# Patient Record
Sex: Female | Born: 1950 | Race: White | Hispanic: No | Smoking: Never smoker
Health system: Southern US, Community
[De-identification: ages and names within clinical notes are randomized; demographics above are authoritative.]

## PROBLEM LIST (undated history)

## (undated) DIAGNOSIS — M810 Age-related osteoporosis without current pathological fracture: Secondary | ICD-10-CM

## (undated) DIAGNOSIS — Z789 Other specified health status: Secondary | ICD-10-CM

## (undated) HISTORY — PX: CRYOTHERAPY: SHX1416

## (undated) HISTORY — DX: Other specified health status: Z78.9

## (undated) HISTORY — DX: Age-related osteoporosis without current pathological fracture: M81.0

---

## 1998-12-15 ENCOUNTER — Ambulatory Visit (HOSPITAL_COMMUNITY): Admission: RE | Admit: 1998-12-15 | Discharge: 1998-12-15 | Payer: Self-pay | Admitting: *Deleted

## 1999-06-01 ENCOUNTER — Ambulatory Visit (HOSPITAL_BASED_OUTPATIENT_CLINIC_OR_DEPARTMENT_OTHER): Admission: RE | Admit: 1999-06-01 | Discharge: 1999-06-01 | Payer: Self-pay | Admitting: Orthopedic Surgery

## 2001-09-03 ENCOUNTER — Encounter: Payer: Self-pay | Admitting: Family Medicine

## 2001-09-03 ENCOUNTER — Ambulatory Visit (HOSPITAL_COMMUNITY): Admission: RE | Admit: 2001-09-03 | Discharge: 2001-09-03 | Payer: Self-pay | Admitting: Family Medicine

## 2002-06-03 ENCOUNTER — Other Ambulatory Visit: Admission: RE | Admit: 2002-06-03 | Discharge: 2002-06-03 | Payer: Self-pay | Admitting: Obstetrics and Gynecology

## 2003-06-15 ENCOUNTER — Other Ambulatory Visit: Admission: RE | Admit: 2003-06-15 | Discharge: 2003-06-15 | Payer: Self-pay | Admitting: Obstetrics and Gynecology

## 2004-08-10 ENCOUNTER — Other Ambulatory Visit: Admission: RE | Admit: 2004-08-10 | Discharge: 2004-08-10 | Payer: Self-pay | Admitting: Obstetrics and Gynecology

## 2005-06-06 ENCOUNTER — Encounter: Admission: RE | Admit: 2005-06-06 | Discharge: 2005-06-06 | Payer: Self-pay | Admitting: Family Medicine

## 2006-12-19 ENCOUNTER — Encounter (INDEPENDENT_AMBULATORY_CARE_PROVIDER_SITE_OTHER): Payer: Self-pay | Admitting: *Deleted

## 2006-12-19 ENCOUNTER — Ambulatory Visit (HOSPITAL_COMMUNITY): Admission: RE | Admit: 2006-12-19 | Discharge: 2006-12-19 | Payer: Self-pay | Admitting: Gastroenterology

## 2007-12-21 ENCOUNTER — Ambulatory Visit (HOSPITAL_COMMUNITY): Admission: RE | Admit: 2007-12-21 | Discharge: 2007-12-21 | Payer: Self-pay | Admitting: Chiropractic Medicine

## 2009-07-11 ENCOUNTER — Emergency Department (HOSPITAL_COMMUNITY): Admission: EM | Admit: 2009-07-11 | Discharge: 2009-07-11 | Payer: Self-pay | Admitting: Emergency Medicine

## 2009-07-14 ENCOUNTER — Ambulatory Visit: Payer: Self-pay | Admitting: Family Medicine

## 2009-07-14 DIAGNOSIS — M25519 Pain in unspecified shoulder: Secondary | ICD-10-CM | POA: Insufficient documentation

## 2010-06-21 ENCOUNTER — Ambulatory Visit: Payer: Self-pay | Admitting: Sports Medicine

## 2010-12-04 NOTE — Assessment & Plan Note (Signed)
Summary: SHOULDER/NECK PAIN,MC   Vital Signs:  Patient profile:   60 year old female Height:      65 inches Weight:      130 pounds BMI:     21.71 BP sitting:   107 / 80  Vitals Entered By: Lillia Pauls CMA (June 21, 2010 3:07 PM)  CC:  shoulder pain.  History of Present Illness: 60 yo F:  1. Shoulder Pain: x 2-3 years, hurts daily, epsecially mid-day. pain primarily in mid back.  injury about 6 yrs ago - was mowing and arm was jerked down by a lawnmower rolling down into a ditch. felt a pull at that time. occasionally has numbness in her r thumb and a burning sensation in the area medial to scapula when her shoulder pain is worst. has tried multiple therapy forms (TENS, PT, MM relaxers, massage, tramadol, acupuncture, ionophoresis).  most didn't help at all.  medications made her feel sick.  lidocaine patch did help. also feels like someone is " pushing their thumb" into the spot on her back that hurts. right hand dominant. No surgery to back or shoulder. works at [employer] as a Diplomatic Services operational officer.  Allergies (verified): No Known Drug Allergies  Review of Systems MS:  Complains of joint pain and mid back pain; denies joint redness, joint swelling, loss of strength, and low back pain. Neuro:  Complains of numbness and tingling.  Physical Exam  General:  Well-developed, well-nourished, in no acute distress; alert, appropriate and cooperative throughout examination. Vitals reviewed.  Msk:  Patient slender but with good muscle bulk and tone generally. Prominent upper trapezius muscles, patient's head in forward position.  ttp along thoracic paraspinal mm, rhomboid mm R > L.  No ttp along spinus processes. Normal neck ROM. R scapula   ~ 1 inch more lateral than left - dysyncrony with upward tracking with wall push.  Shoulder rotator cuff strength B intact and symmetrical, except mild pain and weakness with supraspinatus testing. FROM shoulder joint. Neg compression. Neurologic:  Normal gross  neuro exam upper extremity.   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 Likely 2/2 old injury. Her updated medication list for this problem includes:    Meloxicam 15 Mg Tabs (Meloxicam) ..... One by mouth daily  scap stabilization exercises add ext rotation, flys, rows, etc  MSK [place] on RTC if not improving  doiubt radiation f neck as has great neck ROM  Complete Medication List: 1)  Amitriptyline Hcl 25 Mg Tabs (Amitriptyline hcl) .... One by mouth q hs 2)  Meloxicam 15 Mg Tabs (Meloxicam) .... One by mouth daily  Patient Instructions: 1)  It was nice to meet you today. 2)  Complete the exercises provided in the handout daily. 3)  Follow up in 4 weeks. Prescriptions: MELOXICAM 15 MG TABS (MELOXICAM) one by mouth daily  #30 x 1   Entered by:   Lillia Pauls CMA   Authorized by:   Enid Baas MD   Signed by:   Lillia Pauls CMA on 06/21/2010   Method used:   Print then Give to Patient   RxID:   [id number] AMITRIPTYLINE HCL 25 MG TABS (AMITRIPTYLINE HCL) one by mouth q hs  #30 x 1   Entered by:   Lillia Pauls CMA   Authorized by:   Enid Baas MD   Signed by:   Lillia Pauls CMA on 06/21/2010   Method used:  Print then Give to Patient   RxID:   [id number] MELOXICAM 15 MG TABS (MELOXICAM) one by mouth daily  #30 x 1   Entered by:   Helane Rima DO   Authorized by:   Enid Baas MD   Signed by:   Helane Rima DO on 06/21/2010   Method used:   Print then Give to Patient   RxID:   [id number] AMITRIPTYLINE HCL 25 MG TABS (AMITRIPTYLINE HCL) one by mouth q hs  #30 x 1   Entered by:   Helane Rima DO   Authorized by:   Enid Baas MD   Signed by:   Helane Rima DO on 06/21/2010   Method used:   Print then Give to Patient   RxID:   [id number]

## 2011-03-22 NOTE — Op Note (Signed)
Robin Maldonado, Robin Maldonado                  ACCOUNT NO.:  [id number]   MEDICAL RECORD NO.:  [id number]          PATIENT TYPE:  AMB   LOCATION:  ENDO                         FACILITY:  [facility]   PHYSICIAN:  Bernette Redbird, M.D.   DATE OF BIRTH:  [birth year]   DATE OF PROCEDURE:  12/19/2006  DATE OF DISCHARGE:                               OPERATIVE REPORT   PROCEDURE:  Upper endoscopy with biopsies.   INDICATIONS:  A 60 year old female with periodic nausea.   FINDINGS:  Prepyloric, antral ulceration and erosions suggestive of  aspirin/NSAID gastropathy; although the patient denies consistent or  frequent usage of such medication.  Also, short segment Barrett's  esophagus.   PROCEDURE:  The patient provided written consent for the procedure.  Sedation was fentanyl 75 mcg and Versed 8 mg IV, without arrhythmias or  desaturation.  The Pentax video endoscope was passed under direct  vision, entering the esophagus without undue difficulty.  The vocal  cords looked grossly normal.  The esophagus was normal, except distally,  where there was a postage stamp size tongue of Barrett's appearing  mucosa -- extending perhaps 2 cm above the lower esophageal sphincter  and more proximally than the circumferential Z-line and minimal hiatal  hernia appeared to be.  No ring, stricture, mass or active inflammation  were appreciated.   The stomach was entered.  It contained no residual to speak of.   The antrum of the stomach had focal erosive changes.  In the prepyloric  area, there was actually quite a bit of erythema, edema and a slit-like  linear ulcer with surrounding mucosal edema, with the ulcer measuring  approximately to 2-3 mm across and about 6 mm in length.  It had a  benign appearance, despite the edema and erythema of the surrounding  mucosa.  On the posterior wall of the antrum was shallow erosion, and  there was also one other focus of slight mucosal edema and erythema.  Biopsies of the ulcer,  the erosion and the gastric antrum were obtained.  The remainder of the stomach was normal, including a retroflexed view of  the cardia.  The pylorus, duodenal bulb and second duodenum looked  normal.  Biopsies were obtained from the small Barrett's patch prior to  removal of the scope.  The patient tolerated the procedure well and  there were no apparent complications.   IMPRESSION:  Antral gastritis characterized by shallow ulceration and  erosive changes, with associated mucosal erythema.  Etiology unclear,  since we do not get a history from the patient of significant aspirin or  NSAID exposure.   PLAN:  Await pathology results.  Consider H. pylori therapy if there is  evidence of H. pylori infection.           ______________________________  Bernette Redbird, M.D.     RB/MEDQ  D:  12/19/2006  T:  12/19/2006  Job:  [id number]

## 2011-03-22 NOTE — Op Note (Signed)
Robin Maldonado, Robin Maldonado                  ACCOUNT NO.:  [id number]   MEDICAL RECORD NO.:  [id number]          PATIENT TYPE:  AMB   LOCATION:  ENDO                         FACILITY:  [facility]   PHYSICIAN:  Bernette Redbird, M.D.   DATE OF BIRTH:  1950-12-16   DATE OF PROCEDURE:  12/19/2006  DATE OF DISCHARGE:                               OPERATIVE REPORT   PROCEDURE:  Colonoscopy with biopsy.   INDICATIONS:  60 year old female for initial colon cancer screening  examination.  There is a family history of colon cancer in her mother at  age 24.   FINDINGS:  Solitary diminutive polyp.  Melanosis coli.   PROCEDURE:  The patient provided written consent.  Sedation for this  procedure and the upper endoscopy which preceded it, totaled fentanyl  125 mcg and Versed 12 mg IV without arrhythmias or desaturation.  The  Pentax pediatric video colonoscope was advanced with substantial  difficulty due to looping, ultimately overcome by taking out loops,  having the patient in the supine position, and applying external  abdominal compression.  I reached the terminal ileum which had a normal  appearance and pullback was then performed.  There were two  abnormalities noted on this exam.  The first was a 3 mm slightly  erythematous sessile polyp near the splenic flexure removed by a couple  of cold biopsies, and the other was mild to moderate diffuse melanosis  coli.  No large polyps, cancer, colitis, vascular malformations, or  diverticular disease were appreciated and retroflexion of the rectum and  reinspection of the rectum were unremarkable.  The patient tolerated the  procedure well and there no apparent complications.   IMPRESSION:  1. Solitary diminutive polyp, path pending.  2. Melanosis coli.  3. Family history of colon cancer.   PLAN:  Await pathology, will anticipate colonoscopic follow-up in five  years in view of the family history.           ______________________________  Bernette Redbird, M.D.     RB/MEDQ  D:  12/19/2006  T:  12/19/2006  Job:  [id number]   cc:   Guy Sandifer. Henderson Cloud, M.D.

## 2014-06-23 ENCOUNTER — Other Ambulatory Visit (HOSPITAL_BASED_OUTPATIENT_CLINIC_OR_DEPARTMENT_OTHER): Payer: Self-pay | Admitting: Specialist

## 2014-06-23 DIAGNOSIS — Z1231 Encounter for screening mammogram for malignant neoplasm of breast: Secondary | ICD-10-CM

## 2014-07-01 ENCOUNTER — Ambulatory Visit (HOSPITAL_BASED_OUTPATIENT_CLINIC_OR_DEPARTMENT_OTHER): Payer: Self-pay

## 2014-07-13 ENCOUNTER — Ambulatory Visit (HOSPITAL_BASED_OUTPATIENT_CLINIC_OR_DEPARTMENT_OTHER): Payer: Self-pay

## 2014-07-25 ENCOUNTER — Ambulatory Visit (HOSPITAL_BASED_OUTPATIENT_CLINIC_OR_DEPARTMENT_OTHER)
Admission: RE | Admit: 2014-07-25 | Discharge: 2014-07-25 | Disposition: A | Discharge status: Home / Self Care | Payer: 59 | Source: Ambulatory Visit | Attending: Specialist | Admitting: Specialist

## 2014-07-25 DIAGNOSIS — Z1231 Encounter for screening mammogram for malignant neoplasm of breast: Secondary | ICD-10-CM | POA: Diagnosis present

## 2014-10-06 ENCOUNTER — Ambulatory Visit: Payer: Self-pay | Admitting: Internal Medicine

## 2014-10-10 ENCOUNTER — Encounter: Payer: Self-pay | Admitting: Internal Medicine

## 2016-01-24 ENCOUNTER — Ambulatory Visit (INDEPENDENT_AMBULATORY_CARE_PROVIDER_SITE_OTHER): Payer: 59 | Admitting: Obstetrics & Gynecology

## 2016-01-24 ENCOUNTER — Encounter: Payer: Self-pay | Admitting: Obstetrics & Gynecology

## 2016-01-24 VITALS — BP 117/62 | HR 81 | Ht 65.0 in | Wt 129.0 lb

## 2016-01-24 DIAGNOSIS — Z1239 Encounter for other screening for malignant neoplasm of breast: Secondary | ICD-10-CM | POA: Diagnosis not present

## 2016-01-24 DIAGNOSIS — Z1283 Encounter for screening for malignant neoplasm of skin: Secondary | ICD-10-CM | POA: Diagnosis not present

## 2016-01-24 DIAGNOSIS — Z1151 Encounter for screening for human papillomavirus (HPV): Secondary | ICD-10-CM

## 2016-01-24 DIAGNOSIS — Z124 Encounter for screening for malignant neoplasm of cervix: Secondary | ICD-10-CM

## 2016-01-24 DIAGNOSIS — Z01419 Encounter for gynecological examination (general) (routine) without abnormal findings: Secondary | ICD-10-CM | POA: Diagnosis not present

## 2016-01-24 NOTE — Patient Instructions (Signed)
Osteoporosis  Osteoporosis is the thinning and loss of density in the bones. Osteoporosis makes the bones more brittle, fragile, and likely to break (fracture). Over time, osteoporosis can cause the bones to become so weak that they fracture after a simple fall. The bones most likely to fracture are the bones in the hip, wrist, and spine.  CAUSES   The exact cause is not known.  RISK FACTORS  Anyone can develop osteoporosis. You may be at greater risk if you have a family history of the condition or have poor nutrition. You may also have a higher risk if you are:   · Female.    · 50 years old or older.  · A smoker.  · Not physically active.    · White or Asian.  · Slender.  SIGNS AND SYMPTOMS   A fracture might be the first sign of the disease, especially if it results from a fall or injury that would not usually cause a bone to break. Other signs and symptoms include:   · Low back and neck pain.  · Stooped posture.  · Height loss.  DIAGNOSIS   To make a diagnosis, your health care provider may:  · Take a medical history.  · Perform a physical exam.  · Order tests, such as:    A bone mineral density test.    A dual-energy X-ray absorptiometry test.  TREATMENT   The goal of osteoporosis treatment is to strengthen your bones to reduce your risk of a fracture. Treatment may involve:  · Making lifestyle changes, such as:    Eating a diet rich in calcium.    Doing weight-bearing and muscle-strengthening exercises.    Stopping tobacco use.    Limiting alcohol intake.  · Taking medicine to slow the process of bone loss or to increase bone density.  · Monitoring your levels of calcium and vitamin D.  HOME CARE INSTRUCTIONS  · Include calcium and vitamin D in your diet. Calcium is important for bone health, and vitamin D helps the body absorb calcium.  · Perform weight-bearing and muscle-strengthening exercises as directed by your health care provider.  · Do not use any tobacco products, including cigarettes, chewing  tobacco, and electronic cigarettes. If you need help quitting, ask your health care provider.  · Limit your alcohol intake.  · Take medicines only as directed by your health care provider.  · Keep all follow-up visits as directed by your health care provider. This is important.  · Take precautions at home to lower your risk of falling, such as:    Keeping rooms well lit and clutter free.    Installing safety rails on stairs.    Using rubber mats in the bathroom and other areas that are often wet or slippery.  SEEK IMMEDIATE MEDICAL CARE IF:   You fall or injure yourself.      This information is not intended to replace advice given to you by your health care provider. Make sure you discuss any questions you have with your health care provider.     Document Released: 07/31/2005 Document Revised: 11/11/2014 Document Reviewed: 03/31/2014  Elsevier Interactive Patient Education ©2016 [employer]

## 2016-01-24 NOTE — Progress Notes (Signed)
Patient ID: Robin Maldonado, female   DOB: Mar 18, 1951, 65 y.o.   MRN: [id number] Subjective:     Robin Maldonado is a 65 y.o. female here for a routine exam.  G2P1011. Current complaints: no GYN complaints. LMP early 79's.  Widowed.  Pt not sexually active.   Gynecologic History No LMP recorded. Patient is postmenopausal. Contraception: tubal ligation Last Pap: 2015. Results were: normal (by Dr. Micah Noel.  Normal per pt) Last mammogram: 07/25/2014. Results were: normal  Obstetric History OB History  Gravida Para Term Preterm AB SAB TAB Ectopic Multiple Living  2 1 1       1     # Outcome Date GA Lbr Len/2nd Weight Sex Delivery Anes PTL Lv  2 Gravida           1 Term      Vag-Spont          The following portions of the patient's history were reviewed and updated as appropriate: allergies, current medications, past family history, past medical history, past social history, past surgical history and problem list.  Review of Systems Pertinent items are noted in HPI.    Objective:  BP 117/62 mmHg  Pulse 81  Ht 5\' 5"  (1.651 m)  Wt 129 lb (58.514 kg)  BMI 21.47 kg/m2 General Appearance:    Alert, cooperative, no distress, appears stated age  Head:    Normocephalic, without obvious abnormality, atraumatic  Eyes:    conjunctiva/corneas clear, EOM's intact, both eyes  Ears:    Normal external ear canals, both ears  Nose:   Nares normal, septum midline, mucosa normal, no drainage    or sinus tenderness  Throat:   Lips, mucosa, and tongue normal; teeth and gums normal  Neck:   Supple, symmetrical, trachea midline, no adenopathy;    thyroid:  no enlargement/tenderness/nodules  Back:     Symmetric, no curvature, ROM normal, no CVA tenderness  Lungs:     Clear to auscultation bilaterally, respirations unlabored  Chest Wall:    No tenderness or deformity   Heart:    Regular rate and rhythm, S1 and S2 normal, no murmur, rub   or gallop  Breast Exam:    No tenderness, masses, or nipple  abnormality  Abdomen:     Soft, non-tender, bowel sounds active all four quadrants,    no masses, no organomegaly  Genitalia:    Normal female without lesion, discharge or tenderness     Extremities:   Extremities normal, atraumatic, no cyanosis or edema  Pulses:   2+ and symmetric all extremities  Skin:   Skin color, texture, turgor normal, multiple skin lesions- suspect begn moles vs a few areas of SK.  There are 2 concerning lesion of pts abd- irreg borders with color irreg       Assessment:    Healthy female exam.   Skin lesions- need derm appt to do skin cancer screen increased risk for osteoporosis- thin, fair, premature menopause.  Needs screen at age 65years Breast cancer screen    Plan:    Mammogram ordered. Follow up in: 1 year.    Caltrate 2 po q day F/u PAP Labs: CMP, TSH, lipid panel, vit D.  Pt will have labs drawn fasting on the day of her mammogram  BMD scan in 1 year  Referral to derm for skin exam Obtain records from Dr. Cyndie Chime [facility]  [place]. Harraway-Smith, M.D., Cherlynn June

## 2016-01-26 LAB — CYTOLOGY - PAP

## 2016-01-29 ENCOUNTER — Other Ambulatory Visit (INDEPENDENT_AMBULATORY_CARE_PROVIDER_SITE_OTHER): Payer: 59

## 2016-01-29 ENCOUNTER — Other Ambulatory Visit: Payer: Self-pay | Admitting: Obstetrics & Gynecology

## 2016-01-29 ENCOUNTER — Ambulatory Visit (HOSPITAL_BASED_OUTPATIENT_CLINIC_OR_DEPARTMENT_OTHER)
Admission: RE | Admit: 2016-01-29 | Discharge: 2016-01-29 | Disposition: A | Discharge status: Home / Self Care | Payer: 59 | Source: Ambulatory Visit | Attending: Obstetrics & Gynecology | Admitting: Obstetrics & Gynecology

## 2016-01-29 DIAGNOSIS — Z1239 Encounter for other screening for malignant neoplasm of breast: Secondary | ICD-10-CM | POA: Insufficient documentation

## 2016-01-29 DIAGNOSIS — Z1231 Encounter for screening mammogram for malignant neoplasm of breast: Secondary | ICD-10-CM | POA: Insufficient documentation

## 2016-01-29 DIAGNOSIS — Z01419 Encounter for gynecological examination (general) (routine) without abnormal findings: Secondary | ICD-10-CM

## 2016-01-29 LAB — CBC
HEMATOCRIT: 41.1 % (ref 36.0–46.0)
HEMOGLOBIN: 13.6 g/dL (ref 12.0–15.0)
MCH: 30.6 pg (ref 26.0–34.0)
MCHC: 33.1 g/dL (ref 30.0–36.0)
MCV: 92.4 fL (ref 78.0–100.0)
MPV: 10.9 fL (ref 8.6–12.4)
Platelets: 274 10*3/uL (ref 150–400)
RBC: 4.45 MIL/uL (ref 3.87–5.11)
RDW: 13.9 % (ref 11.5–15.5)
WBC: 4.8 10*3/uL (ref 4.0–10.5)

## 2016-01-29 LAB — TSH: TSH: 1.11 mIU/L

## 2016-01-29 NOTE — Progress Notes (Signed)
Patient presents for fasting lab work. labwork collected. Kathrene Alu RN BSN

## 2016-01-30 LAB — COMPREHENSIVE METABOLIC PANEL
ALT: 11 U/L (ref 6–29)
AST: 17 U/L (ref 10–35)
Albumin: 4 g/dL (ref 3.6–5.1)
Alkaline Phosphatase: 75 U/L (ref 33–130)
BUN: 10 mg/dL (ref 7–25)
CHLORIDE: 107 mmol/L (ref 98–110)
CO2: 22 mmol/L (ref 20–31)
CREATININE: 0.89 mg/dL (ref 0.50–0.99)
Calcium: 8.9 mg/dL (ref 8.6–10.4)
Glucose, Bld: 75 mg/dL (ref 65–99)
POTASSIUM: 4.4 mmol/L (ref 3.5–5.3)
SODIUM: 143 mmol/L (ref 135–146)
TOTAL PROTEIN: 6.7 g/dL (ref 6.1–8.1)
Total Bilirubin: 0.4 mg/dL (ref 0.2–1.2)

## 2016-01-30 LAB — LIPID PANEL
CHOL/HDL RATIO: 2.7 ratio (ref <=5.0)
Cholesterol: 240 mg/dL — ABNORMAL HIGH (ref 125–200)
HDL: 89 mg/dL (ref >=46)
LDL CALC: 140 mg/dL — AB (ref <130)
TRIGLYCERIDES: 54 mg/dL (ref <150)
VLDL: 11 mg/dL (ref <30)

## 2016-01-30 LAB — VITAMIN D 25 HYDROXY (VIT D DEFICIENCY, FRACTURES): VIT D 25 HYDROXY: 24 ng/mL — AB (ref 30–100)

## 2016-03-13 ENCOUNTER — Telehealth: Payer: Self-pay

## 2016-03-13 NOTE — Telephone Encounter (Signed)
Patient called and made aware of low vitamin D level and that she should begin taking over the counter Vitamin D 2000 Unit per day and also increase her calicum. Patient states she did get the calcium chews after her appointment in March. Patient made aware that she will need to have vitamin D repeated in one year. Also mentioned that her cholesterol was on elevated some and she should discuss and follow up with primary care.  Patient mentioned that dermatology had not called her about her referral appointment so patient given number to [facility] [address] in [place] to call and set up appointment. Kathrene Alu RN BSN

## 2016-03-13 NOTE — Telephone Encounter (Signed)
-----   Message from Lavonia Drafts, MD sent at 03/12/2016  9:28 AM EDT ----- Please call pt. Her March labs showed that her Vit D level is low.  She should begin OTC Vit D 2000 units per day. She should also take calcium if she is not already taking it.  She should have her level repeated in 1 year after treatment. (Vit D helps with bone strength and decreases risk of cancer among other things).  Her cholesterol was also a bit elevated and she should f/u with her primary care physician to discuss.  Thx,  clh-S

## 2017-01-03 DIAGNOSIS — H524 Presbyopia: Secondary | ICD-10-CM | POA: Diagnosis not present

## 2017-01-03 DIAGNOSIS — H5213 Myopia, bilateral: Secondary | ICD-10-CM | POA: Diagnosis not present

## 2017-01-03 DIAGNOSIS — H52223 Regular astigmatism, bilateral: Secondary | ICD-10-CM | POA: Diagnosis not present

## 2017-04-08 ENCOUNTER — Other Ambulatory Visit: Payer: Self-pay | Admitting: Obstetrics & Gynecology

## 2017-04-08 DIAGNOSIS — Z1231 Encounter for screening mammogram for malignant neoplasm of breast: Secondary | ICD-10-CM

## 2017-04-14 ENCOUNTER — Encounter (HOSPITAL_BASED_OUTPATIENT_CLINIC_OR_DEPARTMENT_OTHER): Payer: Self-pay

## 2017-04-14 ENCOUNTER — Ambulatory Visit (HOSPITAL_BASED_OUTPATIENT_CLINIC_OR_DEPARTMENT_OTHER)
Admission: RE | Admit: 2017-04-14 | Discharge: 2017-04-14 | Disposition: A | Discharge status: Home / Self Care | Payer: 59 | Source: Ambulatory Visit | Attending: Obstetrics & Gynecology | Admitting: Obstetrics & Gynecology

## 2017-04-14 DIAGNOSIS — Z1231 Encounter for screening mammogram for malignant neoplasm of breast: Secondary | ICD-10-CM | POA: Diagnosis not present

## 2017-08-08 ENCOUNTER — Emergency Department (HOSPITAL_BASED_OUTPATIENT_CLINIC_OR_DEPARTMENT_OTHER): Payer: 59

## 2017-08-08 ENCOUNTER — Encounter (HOSPITAL_BASED_OUTPATIENT_CLINIC_OR_DEPARTMENT_OTHER): Payer: Self-pay | Admitting: *Deleted

## 2017-08-08 ENCOUNTER — Emergency Department (HOSPITAL_BASED_OUTPATIENT_CLINIC_OR_DEPARTMENT_OTHER)
Admission: EM | Admit: 2017-08-08 | Discharge: 2017-08-08 | Disposition: A | Discharge status: Home / Self Care | Payer: 59 | Attending: Emergency Medicine | Admitting: Emergency Medicine

## 2017-08-08 DIAGNOSIS — H9201 Otalgia, right ear: Secondary | ICD-10-CM | POA: Insufficient documentation

## 2017-08-08 DIAGNOSIS — R509 Fever, unspecified: Secondary | ICD-10-CM | POA: Insufficient documentation

## 2017-08-08 DIAGNOSIS — R070 Pain in throat: Secondary | ICD-10-CM | POA: Diagnosis not present

## 2017-08-08 DIAGNOSIS — J40 Bronchitis, not specified as acute or chronic: Secondary | ICD-10-CM | POA: Insufficient documentation

## 2017-08-08 DIAGNOSIS — R110 Nausea: Secondary | ICD-10-CM | POA: Diagnosis not present

## 2017-08-08 DIAGNOSIS — R05 Cough: Secondary | ICD-10-CM | POA: Insufficient documentation

## 2017-08-08 DIAGNOSIS — R059 Cough, unspecified: Secondary | ICD-10-CM

## 2017-08-08 LAB — CBC WITH DIFFERENTIAL/PLATELET
Basophils Absolute: 0 10*3/uL (ref 0.0–0.1)
Basophils Relative: 0 %
Eosinophils Absolute: 0.2 10*3/uL (ref 0.0–0.7)
Eosinophils Relative: 2 %
HEMATOCRIT: 40.8 % (ref 36.0–46.0)
HEMOGLOBIN: 13.2 g/dL (ref 12.0–15.0)
LYMPHS ABS: 1.8 10*3/uL (ref 0.7–4.0)
LYMPHS PCT: 18 %
MCH: 30.6 pg (ref 26.0–34.0)
MCHC: 32.4 g/dL (ref 30.0–36.0)
MCV: 94.4 fL (ref 78.0–100.0)
MONOS PCT: 9 %
Monocytes Absolute: 0.9 10*3/uL (ref 0.1–1.0)
NEUTROS ABS: 7.3 10*3/uL (ref 1.7–7.7)
NEUTROS PCT: 71 %
Platelets: 277 10*3/uL (ref 150–400)
RBC: 4.32 MIL/uL (ref 3.87–5.11)
RDW: 13.6 % (ref 11.5–15.5)
WBC: 10.3 10*3/uL (ref 4.0–10.5)

## 2017-08-08 LAB — URINALYSIS, ROUTINE W REFLEX MICROSCOPIC
BILIRUBIN URINE: NEGATIVE
Glucose, UA: NEGATIVE mg/dL
HGB URINE DIPSTICK: NEGATIVE
KETONES UR: 15 mg/dL — AB
Leukocytes, UA: NEGATIVE
NITRITE: NEGATIVE
PH: 6 (ref 5.0–8.0)
Protein, ur: NEGATIVE mg/dL

## 2017-08-08 LAB — COMPREHENSIVE METABOLIC PANEL
ALK PHOS: 99 U/L (ref 38–126)
ALT: 21 U/L (ref 14–54)
AST: 20 U/L (ref 15–41)
Albumin: 3.9 g/dL (ref 3.5–5.0)
Anion gap: 8 (ref 5–15)
BILIRUBIN TOTAL: 0.5 mg/dL (ref 0.3–1.2)
BUN: 11 mg/dL (ref 6–20)
CALCIUM: 9.1 mg/dL (ref 8.9–10.3)
CHLORIDE: 105 mmol/L (ref 101–111)
CO2: 26 mmol/L (ref 22–32)
CREATININE: 0.74 mg/dL (ref 0.44–1.00)
Glucose, Bld: 123 mg/dL — ABNORMAL HIGH (ref 65–99)
Potassium: 3.5 mmol/L (ref 3.5–5.1)
Sodium: 139 mmol/L (ref 135–145)
Total Protein: 7.4 g/dL (ref 6.5–8.1)

## 2017-08-08 LAB — I-STAT CG4 LACTIC ACID, ED: LACTIC ACID, VENOUS: 1.04 mmol/L (ref 0.5–1.9)

## 2017-08-08 LAB — TSH: TSH: 1.289 u[IU]/mL (ref 0.350–4.500)

## 2017-08-08 MED ORDER — SODIUM CHLORIDE 0.9 % IV BOLUS (SEPSIS)
1000.0000 mL | Freq: Once | INTRAVENOUS | Status: AC
  Administered 2017-08-08: 1000 mL via INTRAVENOUS

## 2017-08-08 MED ORDER — DOXYCYCLINE HYCLATE 100 MG PO TABS
100.0000 mg | ORAL_TABLET | Freq: Once | ORAL | Status: AC
  Administered 2017-08-08: 100 mg via ORAL
  Filled 2017-08-08: qty 1

## 2017-08-08 MED ORDER — ONDANSETRON HCL 4 MG/2ML IJ SOLN
4.0000 mg | Freq: Once | INTRAMUSCULAR | Status: AC
  Administered 2017-08-08: 4 mg via INTRAVENOUS
  Filled 2017-08-08: qty 2

## 2017-08-08 MED ORDER — DOXYCYCLINE HYCLATE 100 MG PO CAPS: 100.0000 mg | ORAL_CAPSULE | Freq: Two times a day (BID) | ORAL | 0 refills | Status: DC

## 2017-08-08 NOTE — ED Triage Notes (Signed)
Fever, cough and sore throat since Monday. Last dose of Ibuprofen was last night.

## 2017-08-08 NOTE — ED Notes (Signed)
Patient stated that she felt a lot better but still light headed.

## 2017-08-08 NOTE — Discharge Instructions (Signed)
Stay hydrated.   You may have an early pneumonia. Take doxycycline twice daily for a week.   See your doctor  Return to ER if you have fever, trouble breathing, abdominal pain, vomiting, dehydration

## 2017-08-08 NOTE — ED Provider Notes (Signed)
Robin Maldonado Note   CSN: [id number] Arrival date & time: 08/08/17  1814     History   Chief Complaint Chief Complaint  Patient presents with  . Fever  . Cough  . Sore Throat    HPI Robin Maldonado is a 66 y.o. female otherwise healthy here presenting with cough, sore throat, subjective fevers. Patient has some productive cough for the last 5 days as well as right ear pain. Patient has subjective fevers but did not take her temperature. Also has intermittent sore throat as well. Patient overall had some poor appetite and feel nauseated but denies any vomiting. Denies any urinary symptoms. Patient's family members were sick with similar symptoms but they got better but she did not. Of note, patient states that for the last several months she has been having poor appetite but denies losing weight.    The history is provided by the patient.    History reviewed. No pertinent past medical history.  Patient Active Problem List   Diagnosis Date Noted  . SHOULDER PAIN, RIGHT 07/14/2009    Past Surgical History:  Procedure Laterality Date  . CRYOTHERAPY      OB History    Gravida Para Term Preterm AB Living   2 1 1     1    SAB TAB Ectopic Multiple Live Births                   Home Medications    Prior to Admission medications   Not on File    Family History Family History  Problem Relation Age of Onset  . Cancer Mother 26       colon  . Heart disease Father   . Stroke Neg Hx   . Diabetes Neg Hx   . Hypertension Neg Hx     Social History Social History  Substance Use Topics  . Smoking status: Never Smoker  . Smokeless tobacco: Never Used  . Alcohol use No     Allergies   Patient has no known allergies.   Review of Systems Review of Systems  Constitutional: Positive for fever.  Respiratory: Positive for cough.   All other systems reviewed and are negative.    Physical Exam Updated Vital Signs BP 129/77 (BP Location: Left  Arm)   Pulse 83   Temp 98.4 F (36.9 C) (Oral)   Resp 18   Ht 5\' 5"  (1.651 m)   Wt 58.1 kg (128 lb)   SpO2 99%   BMI 21.30 kg/m   Physical Exam  Constitutional: She is oriented to person, place, and time.  Tired, slightly dehydrated   HENT:  Head: Normocephalic.  MM dry. Posterior pharynx with no erythema and no tonsillar exudates   Eyes: Pupils are equal, round, and reactive to light. Conjunctivae and EOM are normal.  Neck: Normal range of motion. Neck supple.  Cardiovascular: Normal rate, regular rhythm and normal heart sounds.   Pulmonary/Chest: Effort normal.  Crackles R base   Abdominal: Soft. Bowel sounds are normal. She exhibits no distension. There is no tenderness. There is no guarding.  Musculoskeletal: Normal range of motion. She exhibits no edema.  Neurological: She is alert and oriented to person, place, and time.  Skin: Skin is warm.  Psychiatric: She has a normal mood and affect. Her behavior is normal.  Nursing note and vitals reviewed.    ED Treatments / Results  Labs (all labs ordered are listed, but only abnormal results are displayed) Labs  Reviewed  COMPREHENSIVE METABOLIC PANEL - Abnormal; Notable for the following:       Result Value   Glucose, Bld 123 (*)    All other components within normal limits  URINALYSIS, ROUTINE W REFLEX MICROSCOPIC - Abnormal; Notable for the following:    Specific Gravity, Urine <1.005 (*)    Ketones, ur 15 (*)    All other components within normal limits  CBC WITH DIFFERENTIAL/PLATELET  TSH  I-STAT CG4 LACTIC ACID, ED    EKG  EKG Interpretation None       Radiology Dg Chest 2 View  Result Date: 08/08/2017 CLINICAL DATA:  Cough.  Nausea vomiting.  Sore throat and weakness. EXAM: CHEST  2 VIEW COMPARISON:  Two-view thoracic spine radiographs 12/21/2007 FINDINGS: The heart size and mediastinal contours are within normal limits. Both lungs are clear. The visualized skeletal structures are unremarkable.  IMPRESSION: Negative two view chest x-ray Electronically Signed   By: Robin Maldonado M.D.   On: 08/08/2017 19:52    Procedures Procedures (including critical care time)  Medications Ordered in ED Medications  sodium chloride 0.9 % bolus 1,000 mL (0 mLs Intravenous Stopped 08/08/17 2000)  ondansetron (ZOFRAN) injection 4 mg (4 mg Intravenous Given 08/08/17 2025)  sodium chloride 0.9 % bolus 1,000 mL (0 mLs Intravenous Stopped 08/08/17 2100)  doxycycline (VIBRA-TABS) tablet 100 mg (100 mg Oral Given 08/08/17 2241)     Initial Impression / Assessment and Plan / ED Course  I have reviewed the triage vital signs and the nursing notes.  Pertinent labs & imaging results that were available during my care of the patient were reviewed by me and considered in my medical decision making (see chart for details).     Robin Maldonado is a 66 y.o. female here with sore throat, cough, subjective fevers. Likely pneumonia vs UTI vs viral syndrome. Will get labs, CXR, UA. Will hydrate and reassess.   10:46 PM WBC nl. CXR nl. UA showed some ketones. Still has some crackles R base. Will give doxycycline empirically. She is concerned for possible flu syndrome but symptoms for 5 days already and has no documented fever and is outside window for tamiflu.    Final Clinical Impressions(s) / ED Diagnoses   Final diagnoses:  None    New Prescriptions New Prescriptions   No medications on file     Robin Freeze, MD 08/08/17 2247

## 2017-08-15 ENCOUNTER — Encounter: Payer: Self-pay | Admitting: Family Medicine

## 2017-08-15 ENCOUNTER — Ambulatory Visit (INDEPENDENT_AMBULATORY_CARE_PROVIDER_SITE_OTHER): Payer: 59 | Admitting: Family Medicine

## 2017-08-15 VITALS — BP 98/68 | HR 98 | Temp 98.5°F | Ht 65.0 in | Wt 126.4 lb

## 2017-08-15 DIAGNOSIS — H6982 Other specified disorders of Eustachian tube, left ear: Secondary | ICD-10-CM

## 2017-08-15 DIAGNOSIS — E559 Vitamin D deficiency, unspecified: Secondary | ICD-10-CM

## 2017-08-15 DIAGNOSIS — R5383 Other fatigue: Secondary | ICD-10-CM

## 2017-08-15 LAB — VITAMIN D 25 HYDROXY (VIT D DEFICIENCY, FRACTURES): VITD: 38.14 ng/mL (ref 30.00–100.00)

## 2017-08-15 MED ORDER — FLUTICASONE PROPIONATE 50 MCG/ACT NA SUSP: 2.0000 | Freq: Every day | NASAL | 2 refills | Status: DC

## 2017-08-15 MED ORDER — FLUTICASONE PROPIONATE 50 MCG/ACT NA SUSP: 2.0000 | Freq: Every day | NASAL | 5 refills | Status: DC

## 2017-08-15 MED FILL — FLUTICASONE PROP 50 MCG SPR: 50 | 30 days supply | Qty: 16 | Fill #0

## 2017-08-15 NOTE — Progress Notes (Signed)
Pre visit review using our clinic review tool, if applicable. No additional management support is needed unless otherwise documented below in the visit note. 

## 2017-08-15 NOTE — Patient Instructions (Signed)
Call/send MyChart message if/when you are ready for a sleep study.  Send me a MyChart message if no improvement over the next 3-5 days and I will call in the oral steroid.   Try to stay well hydrated and eat healthy.   Schedule a physical at your earliest convenience.   Let us know if you need anything.

## 2017-08-15 NOTE — Progress Notes (Signed)
Chief Complaint  Patient presents with  . Establish Care  . Dizziness  . Ear Fullness    left       New Patient Visit SUBJECTIVE: HPI: Robin Maldonado is an 66 y.o.female who is being seen for establishing care. Here w daughter.  Pt has around 2 week hx of URI symptoms that are resolving, but hse is having continued vertigo and fullness in L ear. She was seen in ED 1 week ago and rx'd doxy, no benefit. She has not been using anything else at home. No hx of allergies.  Fatigue over past several mo. No inciting event. No change in mood or level of anxiety. She reports that she does snore. Intermittently she'll feel like she is not slept at all. She lives alone and no one has told her that she stops breathing at night. She does not wake up gasping for air. In the emergency department, her blood count and thyroid levels were both normal. She does have history of vitamin D insufficiency. This was tested in the spring of 2017 and has not been retested since then. She is not compliant with vitamin D supplementation.   No Known Allergies  Past Medical History:  Diagnosis Date  . No known health problems      Past Surgical History:  Procedure Laterality Date  . CRYOTHERAPY     Social History   Social History  . Marital status: Widowed   Social History Main Topics  . Smoking status: Never Smoker  . Smokeless tobacco: Never Used  . Alcohol use No  . Drug use: No  . Sexual activity: No   Family History  Problem Relation Age of Onset  . Cancer Mother 71       colon  . Heart disease Father   . Stroke Neg Hx   . Diabetes Neg Hx   . Hypertension Neg Hx     Current Outpatient Prescriptions:  .  doxycycline (VIBRAMYCIN) 100 MG capsule, Take 1 capsule (100 mg total) by mouth 2 (two) times daily. One po bid x 7 days, Disp: 14 capsule, Rfl: 0 .  fluticasone (FLONASE) 50 MCG/ACT nasal spray, Place 2 sprays into both nostrils daily., Disp: 16 g, Rfl: 2  No LMP recorded. Patient is  postmenopausal.  ROS Const: Denies fevers  Respiratory: Denies dyspnea   OBJECTIVE: BP 98/68 (BP Location: Left Arm, Patient Position: Sitting, Cuff Size: Normal)   Pulse 98   Temp 98.5 F (36.9 C) (Oral)   Ht 5\' 5"  (1.651 m)   Wt 126 lb 6 oz (57.3 kg)   SpO2 99%   BMI 21.03 kg/m   Constitutional: -  VS reviewed -  Well developed, well nourished, appears stated age -  No apparent distress  Psychiatric: -  Oriented to person, place, and time -  Memory intact -  Affect and mood normal -  Fluent conversation, good eye contact -  Judgment and insight age appropriate  Eye: -  Conjunctivae clear, no discharge -  Pupils symmetric, round, reactive to light  ENMT: -  MMM    Pharynx moist, no exudate, no erythema -  L canal 70% obstructed w cerumen, TM neg -  R canal 40% obstructed w cerumen, TM neg -  Nares patent, no DC   Neck: -  No gross swelling, no palpable masses -  Thyroid midline, not enlarged, mobile, no palpable masses  Cardiovascular: -  RRR -  No LE edema  Respiratory: -  Normal respiratory  effort, no accessory muscle use, no retraction -  Breath sounds equal, no wheezes, no ronchi, no crackles  Musculoskeletal: -  No clubbing, no cyanosis -  Gait normal  Skin: -  No significant lesion on inspection -  Warm and dry to palpation   ASSESSMENT/PLAN: Dysfunction of left eustachian tube - Plan: DISCONTINUED: fluticasone (FLONASE) 50 MCG/ACT nasal spray  Other fatigue - Plan: Vitamin D (25 hydroxy)  Vitamin D insufficiency - Plan: Vitamin D (25 hydroxy)  Recheck Vit D, start Flonase for ETD. If no improvement over the next 3-5 days, send MyChart message and will call in PO steroid.  She will let us know if fatigue doesn't improve after URI symptoms resolve, we will order sleep referral.  Patient should return at earliest convenience for CPE- declined imms for now. The patient voiced understanding and agreement to the plan.   [place], DO 08/15/17   1:16 PM

## 2017-08-17 ENCOUNTER — Encounter: Payer: Self-pay | Admitting: Family Medicine

## 2017-08-18 ENCOUNTER — Other Ambulatory Visit: Payer: Self-pay | Admitting: Family Medicine

## 2017-08-18 MED ORDER — METHYLPREDNISOLONE 4 MG PO TBPK: ORAL_TABLET | ORAL | 0 refills | Status: DC

## 2017-08-18 NOTE — Progress Notes (Signed)
Medrol dosepak called in as she was not improving to INCS.

## 2017-09-19 ENCOUNTER — Ambulatory Visit (HOSPITAL_BASED_OUTPATIENT_CLINIC_OR_DEPARTMENT_OTHER)
Admission: RE | Admit: 2017-09-19 | Discharge: 2017-09-19 | Disposition: A | Discharge status: Home / Self Care | Payer: 59 | Source: Ambulatory Visit | Attending: Family Medicine | Admitting: Family Medicine

## 2017-09-19 ENCOUNTER — Encounter: Payer: Self-pay | Admitting: Family Medicine

## 2017-09-19 ENCOUNTER — Ambulatory Visit (INDEPENDENT_AMBULATORY_CARE_PROVIDER_SITE_OTHER): Payer: 59 | Admitting: Family Medicine

## 2017-09-19 VITALS — BP 114/78 | HR 88 | Ht 65.0 in | Wt 128.0 lb

## 2017-09-19 DIAGNOSIS — S8991XA Unspecified injury of right lower leg, initial encounter: Secondary | ICD-10-CM | POA: Diagnosis not present

## 2017-09-19 DIAGNOSIS — X501XXA Overexertion from prolonged static or awkward postures, initial encounter: Secondary | ICD-10-CM | POA: Diagnosis not present

## 2017-09-19 DIAGNOSIS — M25561 Pain in right knee: Secondary | ICD-10-CM | POA: Diagnosis not present

## 2017-09-19 NOTE — Patient Instructions (Signed)
You have a Grade 1 MCL sprain and medial meniscus tear. We will go ahead with an MRI to further assess. Ice the knee 15 minutes at a time 3-4 times a day. Aleve 2 tabs twice a day with food OR ibuprofen 600mg  three times a day with food for pain and inflammation as needed. Ok to take tylenol in addition to this. Do simple motion exercises of your knee. Wear knee brace when up and walking around. Further treatment recommendations will depend on the MRI.

## 2017-09-22 ENCOUNTER — Telehealth: Payer: Self-pay | Admitting: Family Medicine

## 2017-09-22 ENCOUNTER — Other Ambulatory Visit: Payer: 59

## 2017-09-22 NOTE — Telephone Encounter (Signed)
Patient choosing to cancel MRI. States her knee is feeling better

## 2017-09-23 ENCOUNTER — Encounter: Payer: Self-pay | Admitting: Family Medicine

## 2017-09-23 DIAGNOSIS — S8991XD Unspecified injury of right lower leg, subsequent encounter: Secondary | ICD-10-CM | POA: Insufficient documentation

## 2017-09-23 NOTE — Progress Notes (Signed)
PCP: [name], DO  Subjective:   HPI: Patient is a 66 y.o. female here for right knee pain.  Patient reports on 11/9 she was in the backyard, slipped in the mud and fell down. Right knee went sideways and struck ground directly on right knee. Able to walk immediately following. No prior knee injuries. Started swelling yesterday. Pain with walking and turning the knee. Has been using stim unit, icing. Pain level now 5/10 and sharp, worse medially. No skin changes, numbness.  Past Medical History:  Diagnosis Date  . No known health problems     Current Outpatient Medications on File Prior to Visit  Medication Sig Dispense Refill  . doxycycline (VIBRAMYCIN) 100 MG capsule Take 1 capsule (100 mg total) by mouth 2 (two) times daily. One po bid x 7 days 14 capsule 0  . fluticasone (FLONASE) 50 MCG/ACT nasal spray Place 2 sprays into both nostrils daily. 16 g 2  . methylPREDNISolone (MEDROL DOSEPAK) 4 MG TBPK tablet Follow instructions on package. 21 tablet 0   No current facility-administered medications on file prior to visit.     Past Surgical History:  Procedure Laterality Date  . CRYOTHERAPY      No Known Allergies  Social History   Socioeconomic History  . Marital status: Widowed    Spouse name: Not on file  . Number of children: Not on file  . Years of education: Not on file  . Highest education level: Not on file  Social Needs  . Financial resource strain: Not on file  . Food insecurity - worry: Not on file  . Food insecurity - inability: Not on file  . Transportation needs - medical: Not on file  . Transportation needs - non-medical: Not on file  Occupational History  . Not on file  Tobacco Use  . Smoking status: Never Smoker  . Smokeless tobacco: Never Used  Substance and Sexual Activity  . Alcohol use: No    Alcohol/week: 0.0 oz  . Drug use: No  . Sexual activity: No  Other Topics Concern  . Not on file  Social History Narrative  .  Not on file    Family History  Problem Relation Age of Onset  . Cancer Mother 13       colon  . Heart disease Father   . Stroke Neg Hx   . Diabetes Neg Hx   . Hypertension Neg Hx     BP 114/78   Pulse 88   Ht 5\' 5"  (1.651 m)   Wt 128 lb (58.1 kg)   BMI 21.30 kg/m   Review of Systems: See HPI above.     Objective:  Physical Exam:  Gen: NAD, comfortable in exam room  Right knee: No gross deformity, ecchymoses.  Mild effusion. TTP medial joint line.  No other tenderness. FROM. Negative ant/post drawers. Pain with valgus but no laxity.  Negative varus.  Negative lachmanns. Positive mcmurrays, apleys, negative patellar apprehension. NV intact distally.  Left knee: No gross deformity, ecchymoses, swelling. No TTP. FROM with full strength. Negative ant/post drawers. Negative valgus/varus testing. NV intact distally.   Assessment & Plan:  1. Right knee pain -  Independently reviewed radiographs and no bony abnormalities.  Exam consistent with grade 1 MCL sprain and medial meniscus tear.  Will go ahead with MRI to confirm, ensure she also doesn't have ACL tear though exam is reassuring.  Icing, aleve or ibuprofen.  Tylenol if needed.  Motion exercises, knee brace for support.

## 2017-09-23 NOTE — Assessment & Plan Note (Signed)
Independently reviewed radiographs and no bony abnormalities.  Exam consistent with grade 1 MCL sprain and medial meniscus tear.  Will go ahead with MRI to confirm, ensure she also doesn't have ACL tear though exam is reassuring.  Icing, aleve or ibuprofen.  Tylenol if needed.  Motion exercises, knee brace for support.

## 2017-10-03 ENCOUNTER — Ambulatory Visit: Payer: 59 | Admitting: Family Medicine

## 2017-10-13 ENCOUNTER — Other Ambulatory Visit: Payer: 59

## 2017-10-15 ENCOUNTER — Ambulatory Visit: Payer: 59 | Admitting: Family Medicine

## 2017-10-20 ENCOUNTER — Ambulatory Visit (INDEPENDENT_AMBULATORY_CARE_PROVIDER_SITE_OTHER): Payer: 59

## 2017-10-20 DIAGNOSIS — S8991XA Unspecified injury of right lower leg, initial encounter: Secondary | ICD-10-CM

## 2017-10-20 DIAGNOSIS — S83411D Sprain of medial collateral ligament of right knee, subsequent encounter: Secondary | ICD-10-CM

## 2017-10-20 DIAGNOSIS — S83411A Sprain of medial collateral ligament of right knee, initial encounter: Secondary | ICD-10-CM | POA: Diagnosis not present

## 2017-10-20 DIAGNOSIS — W19XXXD Unspecified fall, subsequent encounter: Secondary | ICD-10-CM

## 2017-10-22 ENCOUNTER — Ambulatory Visit (INDEPENDENT_AMBULATORY_CARE_PROVIDER_SITE_OTHER): Payer: 59 | Admitting: Family Medicine

## 2017-10-22 ENCOUNTER — Encounter: Payer: Self-pay | Admitting: Family Medicine

## 2017-10-22 DIAGNOSIS — S8991XD Unspecified injury of right lower leg, subsequent encounter: Secondary | ICD-10-CM

## 2017-10-22 NOTE — Patient Instructions (Signed)
Brace, icing, ibuprofen only if needed now. [place] for all activities in the gym but I'd wait another 1-2 weeks before doing side to side motions (side squats, classes like zumba). Ease into squats, lunges like we discussed Follow up with me as needed.

## 2017-10-22 NOTE — Progress Notes (Signed)
PCP: [name], DO  Subjective:   HPI: Patient is a 66 y.o. female here for right knee pain.  11/16: Patient reports on 11/9 she was in the backyard, slipped in the mud and fell down. Right knee went sideways and struck ground directly on right knee. Able to walk immediately following. No prior knee injuries. Started swelling yesterday. Pain with walking and turning the knee. Has been using stim unit, icing. Pain level now 5/10 and sharp, worse medially. No skin changes, numbness.  12/19: Patient reports she's doing well. Pain level is 0/10 though gets some soreness. Hasn't worn a brace past couple days. Bothers turning certain directions. Is icing and taking advil at night. No skin changes, numbness.  Past Medical History:  Diagnosis Date  . No known health problems     Current Outpatient Medications on File Prior to Visit  Medication Sig Dispense Refill  . doxycycline (VIBRAMYCIN) 100 MG capsule Take 1 capsule (100 mg total) by mouth 2 (two) times daily. One po bid x 7 days 14 capsule 0  . fluticasone (FLONASE) 50 MCG/ACT nasal spray Place 2 sprays into both nostrils daily. 16 g 2  . methylPREDNISolone (MEDROL DOSEPAK) 4 MG TBPK tablet Follow instructions on package. 21 tablet 0   No current facility-administered medications on file prior to visit.     Past Surgical History:  Procedure Laterality Date  . CRYOTHERAPY      No Known Allergies  Social History   Socioeconomic History  . Marital status: Widowed    Spouse name: Not on file  . Number of children: Not on file  . Years of education: Not on file  . Highest education level: Not on file  Social Needs  . Financial resource strain: Not on file  . Food insecurity - worry: Not on file  . Food insecurity - inability: Not on file  . Transportation needs - medical: Not on file  . Transportation needs - non-medical: Not on file  Occupational History  . Not on file  Tobacco Use  . Smoking  status: Never Smoker  . Smokeless tobacco: Never Used  Substance and Sexual Activity  . Alcohol use: No    Alcohol/week: 0.0 oz  . Drug use: No  . Sexual activity: No  Other Topics Concern  . Not on file  Social History Narrative  . Not on file    Family History  Problem Relation Age of Onset  . Cancer Mother 47       colon  . Heart disease Father   . Stroke Neg Hx   . Diabetes Neg Hx   . Hypertension Neg Hx     BP 116/81   Pulse 93   Ht 5\' 5"  (1.651 m)   Wt 125 lb (56.7 kg)   BMI 20.80 kg/m   Review of Systems: See HPI above.     Objective:  Physical Exam:  Gen: NAD, comfortable in exam room.  Right knee: No gross deformity, ecchymoses, effusion. Mild TTP medial joint line.  No other tenderness. FROM. Negative ant/post drawers. Negative valgus/varus testing. Negative lachmanns. Pain with mcmurrays, apleys, negative patellar apprehension. NV intact distally.   Assessment & Plan:  1. Right knee pain - Radiographs negative for fracture.  MRI reviewed - bone contusions and MCL sprain noted but no meniscus tear.   Brace, icing, ibuprofen only if needed now.  Reviewed return to gym activities.  F/u prn.

## 2017-10-22 NOTE — Assessment & Plan Note (Signed)
Radiographs negative for fracture.  MRI reviewed - bone contusions and MCL sprain noted but no meniscus tear.   Brace, icing, ibuprofen only if needed now.  Reviewed return to gym activities.  F/u prn.

## 2017-11-21 DIAGNOSIS — L929 Granulomatous disorder of the skin and subcutaneous tissue, unspecified: Secondary | ICD-10-CM | POA: Diagnosis not present

## 2017-11-21 DIAGNOSIS — L821 Other seborrheic keratosis: Secondary | ICD-10-CM | POA: Diagnosis not present

## 2017-11-21 DIAGNOSIS — D485 Neoplasm of uncertain behavior of skin: Secondary | ICD-10-CM | POA: Diagnosis not present

## 2017-11-21 DIAGNOSIS — L814 Other melanin hyperpigmentation: Secondary | ICD-10-CM | POA: Diagnosis not present

## 2018-02-04 ENCOUNTER — Ambulatory Visit (INDEPENDENT_AMBULATORY_CARE_PROVIDER_SITE_OTHER): Payer: PPO | Admitting: Family Medicine

## 2018-02-04 ENCOUNTER — Other Ambulatory Visit: Payer: Self-pay | Admitting: Family Medicine

## 2018-02-04 ENCOUNTER — Encounter: Payer: Self-pay | Admitting: Family Medicine

## 2018-02-04 VITALS — BP 102/68 | HR 84 | Temp 98.4°F | Ht 65.0 in | Wt 131.4 lb

## 2018-02-04 DIAGNOSIS — Z2821 Immunization not carried out because of patient refusal: Secondary | ICD-10-CM | POA: Diagnosis not present

## 2018-02-04 DIAGNOSIS — Z5320 Procedure and treatment not carried out because of patient's decision for unspecified reasons: Secondary | ICD-10-CM | POA: Diagnosis not present

## 2018-02-04 DIAGNOSIS — Z0000 Encounter for general adult medical examination without abnormal findings: Secondary | ICD-10-CM | POA: Diagnosis not present

## 2018-02-04 DIAGNOSIS — Z1159 Encounter for screening for other viral diseases: Secondary | ICD-10-CM

## 2018-02-04 DIAGNOSIS — Z289 Immunization not carried out for unspecified reason: Secondary | ICD-10-CM | POA: Diagnosis not present

## 2018-02-04 NOTE — Progress Notes (Signed)
Subjective:   Robin Maldonado is a 67 y.o. female who presents for an Initial Medicare Annual Wellness Visit.  Review of Systems    10 pt ros neg         Objective:    Today's Vitals   02/04/18 1337  BP: 102/68  Pulse: 84  Temp: 98.4 F (36.9 C)  TempSrc: Oral  SpO2: 98%  Weight: 131 lb 6 oz (59.6 kg)  Height: 5\' 5"  (1.651 m)   Body mass index is 21.86 kg/m.  Advanced Directives 08/08/2017  Does Patient Have a Medical Advance Directive? No    Current Medications (verified) Takes no meds routinely.  Allergies (verified) Patient has no known allergies.   History: Past Medical History:  Diagnosis Date  . No known health problems    Past Surgical History:  Procedure Laterality Date  . CRYOTHERAPY     Family History  Problem Relation Age of Onset  . Cancer Mother 45       colon  . Heart disease Father   . Stroke Neg Hx   . Diabetes Neg Hx   . Hypertension Neg Hx    Social History   Socioeconomic History  . Marital status: Widowed   Tobacco Counseling N/A, non smoker/tobacco user  Clinical Intake: Denies depression of anhedonia  Denies >1 fall in past year  No issues with handling finances  No issues bathing or toileting  No issues eating or preparing meals .  Activities of Daily Living No issues with ADL  Immunizations and Health Maintenance Health Maintenance Due  Topic Date Due  . Hepatitis C Screening  01/05/1951  . TETANUS/TDAP  01/20/1971  . COLONOSCOPY  01/19/2002  . DEXA SCAN  01/19/2017  . PNA vac Low Risk Adult (1 of 2 - PCV13) 01/19/2017   Patient Care Team: [name], DO as PCP - General (Family Medicine)  Indicate any recent Medical Services you may have received from other than [facility] providers in the past year (date may be approximate).     Assessment:   This is a routine wellness examination for Robin Maldonado.  Hearing/Vision screen Pass  Dietary issues and exercise activities discussed:  Exercises  3x/week, healthy diet overall. Cardio, counseled on increasing weight lifting for upper body  Goals    None   Depression Screen PHQ-2: 0  Fall Risk Low  Is the patient's home free of loose throw rugs in walkways, pet beds, electrical cords, etc?   yes      Grab bars in the bathroom? N/A      Handrails on the stairs?   does not have stairs      Adequate lighting?   yes  Timed Get Up and Go Performed  Cognitive Function: A&O x 4, appropriate speech and memory noted        Screening Tests Health Maintenance  Topic Date Due  . Hepatitis C Screening  [birth year]  . TETANUS/TDAP  01/20/1971  . COLONOSCOPY  01/19/2002  . DEXA SCAN  01/19/2017  . PNA vac Low Risk Adult (1 of 2 - PCV13) 01/19/2017  . INFLUENZA VACCINE  06/04/2018  . MAMMOGRAM  04/15/2019    Qualifies for Shingles Vaccine? Yes, info given  Cancer Screenings: Lung: Low Dose CT Chest recommended if Age 5-80 years, 30 pack-year currently smoking OR have quit w/in 15years. Patient does not qualify. Breast: Up to date on Mammogram? Yes   Up to date of Bone Density/Dexa? No Colorectal: in talks   Additional Screenings:  Hepatitis C Screening:      Plan:    Medicare annual wellness visit, initial  Encounter for hepatitis C screening test for low risk patient - Plan: Hepatitis C antibody  Refused Streptococcus pneumoniae vaccination  Tetanus toxoid vaccination refused  Recommendation refused by patient   I have personally reviewed and noted the following in the patient's chart:   . Medical and social history . Use of alcohol, tobacco or illicit drugs  . Current medications and supplements . Functional ability and status . Nutritional status . Physical activity . Advanced directives . List of other physicians . Hospitalizations, surgeries, and ER visits in previous 12 months . Vitals . Screenings to include cognitive, depression, and falls . Referrals and appointments  In addition, I have  reviewed and discussed with patient certain preventive protocols, quality metrics, and best practice recommendations. A written personalized care plan for preventive services as well as general preventive health recommendations were provided to patient.   Pt declined PCV13, Shingrix, Td, and DEXA today, will let us know if she changes her mind.   [place], DO   02/04/2018

## 2018-02-04 NOTE — Patient Instructions (Addendum)
Call your pharmacy to see about the availability of the new shingles vaccine (Shingrix).  Think about the tetanus shot and pneumonia vaccine.  Think about the bone density scan.  Consider lifting weights.   Let me know if you are interested.

## 2018-02-04 NOTE — Progress Notes (Signed)
Pre visit review using our clinic review tool, if applicable. No additional management support is needed unless otherwise documented below in the visit note. 

## 2018-02-05 LAB — HEPATITIS C ANTIBODY
Hepatitis C Ab: NONREACTIVE
SIGNAL TO CUT-OFF: 0.02 (ref <1.00)

## 2018-04-15 ENCOUNTER — Other Ambulatory Visit: Payer: Self-pay | Admitting: Family Medicine

## 2018-04-15 DIAGNOSIS — Z1231 Encounter for screening mammogram for malignant neoplasm of breast: Secondary | ICD-10-CM

## 2018-04-21 ENCOUNTER — Ambulatory Visit (HOSPITAL_BASED_OUTPATIENT_CLINIC_OR_DEPARTMENT_OTHER)
Admission: RE | Admit: 2018-04-21 | Discharge: 2018-04-21 | Disposition: A | Discharge status: Home / Self Care | Payer: PPO | Source: Ambulatory Visit | Attending: Family Medicine | Admitting: Family Medicine

## 2018-04-21 DIAGNOSIS — Z1231 Encounter for screening mammogram for malignant neoplasm of breast: Secondary | ICD-10-CM | POA: Insufficient documentation

## 2018-05-04 ENCOUNTER — Encounter: Payer: Self-pay | Admitting: Family Medicine

## 2018-05-04 DIAGNOSIS — Z1211 Encounter for screening for malignant neoplasm of colon: Secondary | ICD-10-CM | POA: Diagnosis not present

## 2018-05-04 DIAGNOSIS — D126 Benign neoplasm of colon, unspecified: Secondary | ICD-10-CM | POA: Diagnosis not present

## 2018-05-04 DIAGNOSIS — K6389 Other specified diseases of intestine: Secondary | ICD-10-CM | POA: Diagnosis not present

## 2018-05-04 DIAGNOSIS — Z800 Family history of malignant neoplasm of digestive organs: Secondary | ICD-10-CM | POA: Diagnosis not present

## 2018-05-06 DIAGNOSIS — K6389 Other specified diseases of intestine: Secondary | ICD-10-CM | POA: Diagnosis not present

## 2018-05-06 DIAGNOSIS — D126 Benign neoplasm of colon, unspecified: Secondary | ICD-10-CM | POA: Diagnosis not present

## 2018-08-24 ENCOUNTER — Encounter: Payer: Self-pay | Admitting: Family

## 2018-08-24 ENCOUNTER — Ambulatory Visit (INDEPENDENT_AMBULATORY_CARE_PROVIDER_SITE_OTHER): Payer: PPO | Admitting: Family

## 2018-08-24 VITALS — BP 94/81 | HR 77 | Temp 98.1°F | Resp 16 | Ht 65.0 in | Wt 133.0 lb

## 2018-08-24 DIAGNOSIS — M25511 Pain in right shoulder: Secondary | ICD-10-CM | POA: Diagnosis not present

## 2018-08-24 DIAGNOSIS — L290 Pruritus ani: Secondary | ICD-10-CM | POA: Diagnosis not present

## 2018-08-24 DIAGNOSIS — G8929 Other chronic pain: Secondary | ICD-10-CM | POA: Diagnosis not present

## 2018-08-24 MED ORDER — MELOXICAM 7.5 MG PO TABS: 7.5000 mg | ORAL_TABLET | Freq: Every day | ORAL | 0 refills | Status: DC

## 2018-08-24 MED ORDER — HYDROCORTISONE 2.5 % RE CREA: 1.0000 "application " | TOPICAL_CREAM | Freq: Two times a day (BID) | RECTAL | 0 refills | Status: DC

## 2018-08-24 NOTE — Patient Instructions (Addendum)
Please complete stool sample and return to lab at your earliest convenience.  Begin meloxicam once daily for your shoulder pain. You should be contacted about your referral to sports medicine. Apply rectal cream twice daily. Call if symptoms worsen or fail to imprvoe.

## 2018-08-24 NOTE — Progress Notes (Signed)
Subjective:    Patient ID: Robin Maldonado, female    DOB: [birth year], 67 y.o.   MRN: [id number]  HPI  Patient is a 67 yr old female who presents today with two concerns:  1) Anal itching- reports that this has been going on for 1 month.  No recent travel.  Denies vaginal itching. She does report + hx of hemorrhoids.   2) Shoulder pain- reports pain in right shoulder for "years."  Reports pain is worst with trying to raise her arm over her had.  She is retired form PT (office work).    Review of Systems See HPI  Past Medical History:  Diagnosis Date  . No known health problems      Social History   Socioeconomic History  . Marital status: Widowed    Spouse name: Not on file  . Number of children: Not on file  . Years of education: Not on file  . Highest education level: Not on file  Occupational History  . Not on file  Social Needs  . Financial resource strain: Not on file  . Food insecurity:    Worry: Not on file    Inability: Not on file  . Transportation needs:    Medical: Not on file    Non-medical: Not on file  Tobacco Use  . Smoking status: Never Smoker  . Smokeless tobacco: Never Used  Substance and Sexual Activity  . Alcohol use: No    Alcohol/week: 0.0 standard drinks  . Drug use: No  . Sexual activity: Never  Lifestyle  . Physical activity:    Days per week: Not on file    Minutes per session: Not on file  . Stress: Not on file  Relationships  . Social connections:    Talks on phone: Not on file    Gets together: Not on file    Attends religious service: Not on file    Active member of club or organization: Not on file    Attends meetings of clubs or organizations: Not on file    Relationship status: Not on file  . Intimate partner violence:    Fear of current or ex partner: Not on file    Emotionally abused: Not on file    Physically abused: Not on file    Forced sexual activity: Not on file  Other Topics Concern  . Not on file  Social  History Narrative  . Not on file    Past Surgical History:  Procedure Laterality Date  . CRYOTHERAPY      Family History  Problem Relation Age of Onset  . Cancer Mother 19       colon  . Heart disease Father   . Stroke Neg Hx   . Diabetes Neg Hx   . Hypertension Neg Hx     No Known Allergies  No current outpatient medications on file prior to visit.   No current facility-administered medications on file prior to visit.     BP 94/81 (BP Location: Right Arm, Patient Position: Sitting, Cuff Size: Small)   Pulse 77   Temp 98.1 F (36.7 C) (Oral)   Resp 16   Ht 5\' 5"  (1.651 m)   Wt 133 lb (60.3 kg)   SpO2 100%   BMI 22.13 kg/m       Objective:   Physical Exam  Constitutional: She appears well-developed and well-nourished.  Cardiovascular: Normal rate, regular rhythm and normal heart sounds.  No murmur heard. Pulmonary/Chest: Effort  normal and breath sounds normal. No respiratory distress. She has no wheezes.  Genitourinary:  Genitourinary Comments: Normal internal rectal exam, normal external anal area, no breakdown or hemorrhoids noted.   Musculoskeletal:  Right shoulder is without swelling or tenderness to palpation.  + pain with abduction of right shoulder.   Psychiatric: She has a normal mood and affect. Her behavior is normal. Judgment and thought content normal.          Assessment & Plan:  Anal itching- rx with proctozone cream. She is concerned about parasites. Will have her collect and return specimen for O and P.  Right shoulder strain-suspect RTC strain, rx with meloxicam and refer sports medicine for further evaluation.

## 2018-08-25 ENCOUNTER — Other Ambulatory Visit: Payer: PPO

## 2018-08-25 DIAGNOSIS — L290 Pruritus ani: Secondary | ICD-10-CM | POA: Diagnosis not present

## 2018-08-27 ENCOUNTER — Ambulatory Visit (INDEPENDENT_AMBULATORY_CARE_PROVIDER_SITE_OTHER): Payer: PPO | Admitting: Family Medicine

## 2018-08-27 DIAGNOSIS — M25511 Pain in right shoulder: Secondary | ICD-10-CM | POA: Diagnosis not present

## 2018-08-27 LAB — OVA AND PARASITE EXAMINATION
CONCENTRATE RESULT:: NONE SEEN
SPECIMEN QUALITY: ADEQUATE
TRICHROME RESULT: NONE SEEN
VKL: 91268571

## 2018-08-27 MED ORDER — METHYLPREDNISOLONE ACETATE 40 MG/ML IJ SUSP
40.0000 mg | Freq: Once | INTRAMUSCULAR | Status: AC
  Administered 2018-08-27: 40 mg via INTRA_ARTICULAR

## 2018-08-27 NOTE — Patient Instructions (Signed)
You have strain/spasms of your rhomboids and scapulothoracic bursitis. You were given trigger point injections today - if these don't help as much as expected we can consider bursitis injection. Meloxicam as needed for pain and inflammation. Wait 5-7 days then start the scapular strengthening exercises. If doing well follow up with me in 5-6 weeks.

## 2018-08-28 ENCOUNTER — Encounter: Payer: Self-pay | Admitting: Family

## 2018-08-31 ENCOUNTER — Encounter: Payer: Self-pay | Admitting: Family Medicine

## 2018-08-31 NOTE — Progress Notes (Signed)
PCP: [name], DO Consultation requested by:  Debbrah Alar NP  Subjective:   HPI: Patient is a 67 y.o. female here for right shoulder pain.  Patient reports she's had several years of pain posterior right shoulder. She worked in [employer] in [place] [employer] and over the years has tried physical therapy, ultrasound, e-stim, tens, acupuncture, dry needling with minimal results. Pain up to 7/10 and sharp. Feels pain medial to and deep to scapula. Tried meloxicam recently, previously OTC NSAIDs. She does feel a grinding sensation deep to scapula at times. No skin changes, numbness, radiation into arm.  Past Medical History:  Diagnosis Date  . No known health problems     Current Outpatient Medications on File Prior to Visit  Medication Sig Dispense Refill  . hydrocortisone (PROCTOZONE-HC) 2.5 % rectal cream Place 1 application rectally 2 (two) times daily. 30 g 0  . meloxicam (MOBIC) 7.5 MG tablet Take 1 tablet (7.5 mg total) by mouth daily. 14 tablet 0   No current facility-administered medications on file prior to visit.     Past Surgical History:  Procedure Laterality Date  . CRYOTHERAPY      No Known Allergies  Social History   Socioeconomic History  . Marital status: Widowed    Spouse name: Not on file  . Number of children: Not on file  . Years of education: Not on file  . Highest education level: Not on file  Occupational History  . Not on file  Social Needs  . Financial resource strain: Not on file  . Food insecurity:    Worry: Not on file    Inability: Not on file  . Transportation needs:    Medical: Not on file    Non-medical: Not on file  Tobacco Use  . Smoking status: Never Smoker  . Smokeless tobacco: Never Used  Substance and Sexual Activity  . Alcohol use: No    Alcohol/week: 0.0 standard drinks  . Drug use: No  . Sexual activity: Never  Lifestyle  . Physical activity:    Days per week: Not on file    Minutes per session:  Not on file  . Stress: Not on file  Relationships  . Social connections:    Talks on phone: Not on file    Gets together: Not on file    Attends religious service: Not on file    Active member of club or organization: Not on file    Attends meetings of clubs or organizations: Not on file    Relationship status: Not on file  . Intimate partner violence:    Fear of current or ex partner: Not on file    Emotionally abused: Not on file    Physically abused: Not on file    Forced sexual activity: Not on file  Other Topics Concern  . Not on file  Social History Narrative  . Not on file    Family History  Problem Relation Age of Onset  . Cancer Mother 67       colon  . Heart disease Father   . Stroke Neg Hx   . Diabetes Neg Hx   . Hypertension Neg Hx     BP 114/85   Pulse 80   Ht 5\' 5"  (1.651 m)   Wt 133 lb (60.3 kg)   BMI 22.13 kg/m   Review of Systems: See HPI above.     Objective:  Physical Exam:  Gen: NAD, comfortable in exam room  Right  shoulder: No swelling, ecchymoses.  No gross deformity.  No winging. TTP medial to medial scapular border. FROM. Negative Hawkins, Neers. Negative Yergasons. Strength 5/5 with empty can and resisted internal/external rotation. Negative apprehension. NV intact distally.  Left shoulder: No swelling, ecchymoses.  No gross deformity. No TTP. FROM. Strength 5/5 with empty can and resisted internal/external rotation. NV intact distally.   Assessment & Plan:  1. Right shoulder pain - 2/2 strain/spasms of rhomboids and scapulothoracic bursitis.  She's had extensive conservative treatment in past.  She will take meloxicam daily, reviewed home exercises as well.  Opted for trigger point injections into rhomboid musculature.  F/u in 5-6 weeks.  Consider scapulothoracic bursa injection if still struggling.  After informed written consent timeout was performed.  Patient was seated in chair in exam room.  Area overlying rhomboids in 4  area prepped with alcohol swab then trigger point injections performed with 74mL into each of a 3:1 bupivicaine:depomedrol solution.  Patient tolerated procedure well without immediate complications.

## 2018-09-29 ENCOUNTER — Ambulatory Visit: Payer: PPO | Admitting: Family Medicine

## 2018-10-08 DIAGNOSIS — H2513 Age-related nuclear cataract, bilateral: Secondary | ICD-10-CM | POA: Diagnosis not present

## 2018-12-01 ENCOUNTER — Encounter: Payer: Self-pay | Admitting: Medical

## 2018-12-01 ENCOUNTER — Ambulatory Visit (HOSPITAL_BASED_OUTPATIENT_CLINIC_OR_DEPARTMENT_OTHER)
Admission: RE | Admit: 2018-12-01 | Discharge: 2018-12-01 | Disposition: A | Discharge status: Home / Self Care | Payer: PPO | Source: Ambulatory Visit | Attending: Medical | Admitting: Medical

## 2018-12-01 ENCOUNTER — Ambulatory Visit (INDEPENDENT_AMBULATORY_CARE_PROVIDER_SITE_OTHER): Payer: PPO | Admitting: Medical

## 2018-12-01 VITALS — BP 109/65 | HR 88 | Temp 98.6°F | Resp 16 | Ht 65.0 in | Wt 132.8 lb

## 2018-12-01 DIAGNOSIS — J069 Acute upper respiratory infection, unspecified: Secondary | ICD-10-CM | POA: Diagnosis not present

## 2018-12-01 DIAGNOSIS — R05 Cough: Secondary | ICD-10-CM | POA: Diagnosis not present

## 2018-12-01 DIAGNOSIS — R0981 Nasal congestion: Secondary | ICD-10-CM | POA: Diagnosis not present

## 2018-12-01 DIAGNOSIS — R059 Cough, unspecified: Secondary | ICD-10-CM

## 2018-12-01 MED ORDER — FLUTICASONE PROPIONATE 50 MCG/ACT NA SUSP: 2.0000 | Freq: Every day | NASAL | 0 refills | Status: DC

## 2018-12-01 MED ORDER — HYDROCODONE-HOMATROPINE 5-1.5 MG/5ML PO SYRP: 5.0000 mL | ORAL_SOLUTION | Freq: Four times a day (QID) | ORAL | 0 refills | Status: DC | PRN

## 2018-12-01 MED ORDER — BENZONATATE 100 MG PO CAPS: 100.0000 mg | ORAL_CAPSULE | Freq: Three times a day (TID) | ORAL | 0 refills | Status: DC | PRN

## 2018-12-01 MED FILL — HYDROCODONE-HOMATROPINE SYR: 5-1.5 | 5 days supply | Qty: 100 | Fill #0

## 2018-12-01 MED FILL — FLUTICASONE PROP 50 MCG SPR: 50 | 30 days supply | Qty: 16 | Fill #0

## 2018-12-01 MED FILL — BENZONATATE 100 MG CAP: 100 | 10 days supply | Qty: 30 | Fill #0

## 2018-12-01 NOTE — Patient Instructions (Signed)
You do appear to have upper respiratory infection type symptoms. No clear indication presently the antibiotic needed.  But will watch closely to see if you developing more symptoms such as bronchitis-like, sinusitis-like or pneumonia like.  Will get a chest x-ray today.  Make sure no pneumonia seen.  Prescription of Hycodan sent to your pharmacy.  You might just use this at night she can sleep better.  For cough during the day can use benzonatate.  Prescription sent to pharmacy.  For nasal congestion, prescribed Flonase.  We will update you on x-ray results.  Also asked that you give me an update if your signs and symptoms worsen despite above measures.  In that event could give antibiotics later in the week if necessary.  Follow-up in 7 to 10 days or as needed.

## 2018-12-01 NOTE — Progress Notes (Signed)
Subjective:    Patient ID: Robin Maldonado, female    DOB: September 02, 1951, 68 y.o.   MRN: [id number]  HPI  Pt in for cough since past Thursday. Pt states she had dry hacking cough. Just now starting to bring up some mucus.   Pt tried mucinex and nyquil over the counter.   Some nasal congestion.  Pt states coughing can be continuous and keeps her up.  When she cough has ha. Can be so severe almost dry heaves.  No fever,no chills or sweats. No diffuse myalgias.  Pt not former smoker.  Pt states no chronic bronchitis. But 2 years ago describes one event with probable bronchitis.  No wheezing.    Review of Systems  Constitutional: Negative for chills, fatigue and fever.  HENT: Positive for congestion and postnasal drip. Negative for sinus pressure, sneezing and sore throat.   Respiratory: Positive for cough. Negative for chest tightness, shortness of breath and wheezing.   Cardiovascular: Negative for chest pain and palpitations.  Gastrointestinal: Negative for abdominal pain.  Genitourinary: Negative for dysuria and enuresis.  Musculoskeletal: Negative for arthralgias, back pain, gait problem and neck pain.  Skin: Negative for rash.  Neurological: Negative for dizziness, speech difficulty, weakness, numbness and headaches.  Hematological: Negative for adenopathy. Does not bruise/bleed easily.  Psychiatric/Behavioral: Negative for behavioral problems, confusion and sleep disturbance. The patient is not nervous/anxious.     Past Medical History:  Diagnosis Date  . No known health problems      Social History   Socioeconomic History  . Marital status: Widowed    Spouse name: Not on file  . Number of children: Not on file  . Years of education: Not on file  . Highest education level: Not on file  Occupational History  . Not on file  Social Needs  . Financial resource strain: Not on file  . Food insecurity:    Worry: Not on file    Inability: Not on file  .  Transportation needs:    Medical: Not on file    Non-medical: Not on file  Tobacco Use  . Smoking status: Never Smoker  . Smokeless tobacco: Never Used  Substance and Sexual Activity  . Alcohol use: No    Alcohol/week: 0.0 standard drinks  . Drug use: No  . Sexual activity: Never  Lifestyle  . Physical activity:    Days per week: Not on file    Minutes per session: Not on file  . Stress: Not on file  Relationships  . Social connections:    Talks on phone: Not on file    Gets together: Not on file    Attends religious service: Not on file    Active member of club or organization: Not on file    Attends meetings of clubs or organizations: Not on file    Relationship status: Not on file  . Intimate partner violence:    Fear of current or ex partner: Not on file    Emotionally abused: Not on file    Physically abused: Not on file    Forced sexual activity: Not on file  Other Topics Concern  . Not on file  Social History Narrative  . Not on file    Past Surgical History:  Procedure Laterality Date  . CRYOTHERAPY      Family History  Problem Relation Age of Onset  . Cancer Mother 15       colon  . Heart disease Father   .  Stroke Neg Hx   . Diabetes Neg Hx   . Hypertension Neg Hx     No Known Allergies  Current Outpatient Medications on File Prior to Visit  Medication Sig Dispense Refill  . hydrocortisone (PROCTOZONE-HC) 2.5 % rectal cream Place 1 application rectally 2 (two) times daily. 30 g 0   No current facility-administered medications on file prior to visit.     BP 109/65   Pulse 88   Temp 98.6 F (37 C) (Oral)   Resp 16   Ht 5\' 5"  (1.651 m)   Wt 132 lb 12.8 oz (60.2 kg)   SpO2 100%   BMI 22.10 kg/m       Objective:   Physical Exam  General  Mental Status - Alert. General Appearance - Well groomed. Not in acute distress.  Skin Rashes- No Rashes.  HEENT Head- Normal. Ear Auditory Canal - Left- Normal. Right - Normal.Tympanic  Membrane- Left- Normal. Right- Normal. Eye Sclera/Conjunctiva- Left- Normal. Right- Normal. Nose & Sinuses Nasal Mucosa- Left-  Boggy and Congested. Right-  Boggy and  Congested.Bilateral maxillary and frontal sinus pressure. Mouth & Throat Lips: Upper Lip- Normal: no dryness, cracking, pallor, cyanosis, or vesicular eruption. Lower Lip-Normal: no dryness, cracking, pallor, cyanosis or vesicular eruption. Buccal Mucosa- Bilateral- No Aphthous ulcers. Oropharynx- No Discharge or Erythema. Tonsils: Characteristics- Bilateral- No Erythema or Congestion. Size/Enlargement- Bilateral- No enlargement. Discharge- bilateral-None.  Neck Neck- Supple. No Masses.   Chest and Lung Exam Auscultation: Breath Sounds:-Clear even and unlabored.  Cardiovascular Auscultation:Rythm- Regular, rate and rhythm. Murmurs & Other Heart Sounds:Ausculatation of the heart reveal- No Murmurs.  Lymphatic Head & Neck General Head & Neck Lymphatics: Bilateral: Description- No Localized lymphadenopathy.       Assessment & Plan:  You do appear to have upper respiratory infection type symptoms. No clear indication presently the antibiotic needed.  But will watch closely to see if you developing more symptoms such as bronchitis-like, sinusitis-like or pneumonia like.  Will get a chest x-ray today.  Make sure no pneumonia seen.  Prescription of Hycodan sent to your pharmacy.  You might just use this at night she can sleep better.  For cough during the day can use benzonatate.  Prescription sent to pharmacy.  For nasal congestion, prescribed Flonase.  We will update you on x-ray results.  Also asked that you give me an update if your signs and symptoms worsen despite above measures.  In that event could give antibiotics later in the week if necessary.  Follow-up in 7 to 10 days or as needed.  Mackie Pai, PA-C

## 2018-12-02 ENCOUNTER — Telehealth: Payer: Self-pay | Admitting: Medical

## 2018-12-02 MED ORDER — ONDANSETRON 4 MG PO TBDP
4.0000 mg | ORAL_TABLET | Freq: Three times a day (TID) | ORAL | 0 refills | Status: DC | PRN
Start: 2018-12-02 — End: 2019-01-01

## 2018-12-02 NOTE — Telephone Encounter (Signed)
Prescription of Zofran sent to patient's pharmacy.

## 2018-12-03 ENCOUNTER — Telehealth: Payer: Self-pay | Admitting: Family Medicine

## 2018-12-03 NOTE — Telephone Encounter (Signed)
Copied from [place] [id number]. Topic: Quick Communication - Lab Results (Clinic Use ONLY) >> Dec 03, 2018  2:34 PM Hinton Dyer, [place] wrote: Called patient to inform them of 12/01/18 lab results. When patient returns call, triage nurse may disclose results.  Pt states if it is for her chest xray she knows that it was clear

## 2018-12-03 NOTE — Telephone Encounter (Signed)
See result note.  

## 2019-01-01 ENCOUNTER — Encounter: Payer: Self-pay | Admitting: Family Medicine

## 2019-01-01 ENCOUNTER — Ambulatory Visit (INDEPENDENT_AMBULATORY_CARE_PROVIDER_SITE_OTHER): Payer: PPO | Admitting: Family Medicine

## 2019-01-01 VITALS — BP 118/68 | HR 90 | Temp 98.4°F | Ht 65.0 in | Wt 133.1 lb

## 2019-01-01 DIAGNOSIS — M545 Low back pain, unspecified: Secondary | ICD-10-CM

## 2019-01-01 MED ORDER — METHYLPREDNISOLONE 4 MG PO TBPK: ORAL_TABLET | ORAL | 0 refills | Status: DC

## 2019-01-01 MED ORDER — CYCLOBENZAPRINE HCL 5 MG PO TABS: 5.0000 mg | ORAL_TABLET | Freq: Three times a day (TID) | ORAL | 0 refills | Status: DC | PRN

## 2019-01-01 NOTE — Progress Notes (Signed)
Musculoskeletal Exam  Patient: Robin Maldonado DOB: 1951-10-30  DOS: 01/01/2019  SUBJECTIVE:  Chief Complaint:   Chief Complaint  Patient presents with  . Back Pain    Robin Maldonado is a 68 y.o.  female for evaluation and treatment of his back pain.   Onset:  1 week ago. No inj or change in activity.  Location: b/l lower Character:  aching and sharp  Progression of issue:  is unchanged Associated symptoms: none Denies bowel/bladder incontinence or weakness Treatment: to date has been OTC NSAIDS and home exercises.   Neurovascular symptoms: no  ROS: Musculoskeletal/Extremities: +back pain Neurologic: no numbness, tingling no weakness   Past Medical History:  Diagnosis Date  . No known health problems     Objective:  VITAL SIGNS: BP 118/68 (BP Location: Left Arm, Patient Position: Sitting, Cuff Size: Normal)   Pulse 90   Temp 98.4 F (36.9 C) (Oral)   Ht 5\' 5"  (1.651 m)   Wt 133 lb 2 oz (60.4 kg)   SpO2 98%   BMI 22.15 kg/m  Constitutional: Well formed, well developed. No acute distress. HENT: Normocephalic, atraumatic.  Thorax & Lungs:  No accessory muscle use Extremities: No clubbing. No cyanosis. No edema.  Skin: Warm. Dry. No erythema. No rash.  Musculoskeletal: low back.   Tenderness to palpation: yes- over midline lumbar and R parasp msc Deformity: no Ecchymosis: no Straight leg test: negative for Poor hamstring flexibility b/l. Neurologic: Normal sensory function. No focal deficits noted. DTR's equal and symmetry in LE's. No clonus. Psychiatric: Normal mood. Age appropriate judgment and insight. Alert & oriented x 3.    Assessment:  Acute right-sided low back pain without sciatica - Plan: methylPREDNISolone (MEDROL DOSEPAK) 4 MG TBPK tablet, cyclobenzaprine (FLEXERIL) 5 MG tablet  Plan: Low dose Flexeril. Medrol Dosepak if no improvement.  Stretches/exercises, heat, ice, Tylenol. F/u at earliest convenience for CPE to ensure health  maintenance UTD. The patient voiced understanding and agreement to the plan.   [place], DO 01/01/19  8:35 AM

## 2019-01-01 NOTE — Patient Instructions (Addendum)
Heat (pad or rice pillow in microwave) over affected area, 10-15 minutes twice daily.  ° °OK to take Tylenol 1000 mg (2 extra strength tabs) or 975 mg (3 regular strength tabs) every 6 hours as needed. ° °Take Flexeril (cyclobenzaprine) 1-2 hours before planned bedtime. If it makes you drowsy, do not take during the day. You can try half a tab the following night. ° °EXERCISES  °RANGE OF MOTION (ROM) AND STRETCHING EXERCISES - Low Back Pain °Most people with lower back pain will find that their symptoms get worse with excessive bending forward (flexion) or arching at the lower back (extension). The exercises that will help resolve your symptoms will focus on the opposite motion.  °If you have pain, numbness or tingling which travels down into your buttocks, leg or foot, the goal of the therapy is for these symptoms to move closer to your back and eventually resolve. Sometimes, these leg symptoms will get better, but your lower back pain may worsen. This is often an indication of progress in your rehabilitation. Be very alert to any changes in your symptoms and the activities in which you participated in the 24 hours prior to the change. Sharing this information with your caregiver will allow him or her to most efficiently treat your condition. °These exercises may help you when beginning to rehabilitate your injury. Your symptoms may resolve with or without further involvement from your physician, physical therapist or athletic trainer. While completing these exercises, remember:  °Restoring tissue flexibility helps normal motion to return to the joints. This allows healthier, less painful movement and activity. °An effective stretch should be held for at least 30 seconds. °A stretch should never be painful. You should only feel a gentle lengthening or release in the stretched tissue. °FLEXION RANGE OF MOTION AND STRETCHING EXERCISES: ° °STRETCH - Flexion, Single Knee to Chest  °Lie on a firm bed or floor with both  legs extended in front of you. °Keeping one leg in contact with the floor, bring your opposite knee to your chest. Hold your leg in place by either grabbing behind your thigh or at your knee. °Pull until you feel a gentle stretch in your low back. Hold 30 seconds. °Slowly release your grasp and repeat the exercise with the opposite side. °Repeat 2 times. Complete this exercise 3 times per week.  ° °STRETCH - Flexion, Double Knee to Chest °Lie on a firm bed or floor with both legs extended in front of you. °Keeping one leg in contact with the floor, bring your opposite knee to your chest. °Tense your stomach muscles to support your back and then lift your other knee to your chest. Hold your legs in place by either grabbing behind your thighs or at your knees. °Pull both knees toward your chest until you feel a gentle stretch in your low back. Hold 30 seconds. °Tense your stomach muscles and slowly return one leg at a time to the floor. °Repeat 2 times. Complete this exercise 3 times per week.  ° °STRETCH - Low Trunk Rotation °Lie on a firm bed or floor. Keeping your legs in front of you, bend your knees so they are both pointed toward the ceiling and your feet are flat on the floor. °Extend your arms out to the side. This will stabilize your upper body by keeping your shoulders in contact with the floor. °Gently and slowly drop both knees together to one side until you feel a gentle stretch in your low back. Hold for   30 seconds. °Tense your stomach muscles to support your lower back as you bring your knees back to the starting position. Repeat the exercise to the other side. °Repeat 2 times. Complete this exercise at least 3 times per week. °  °EXTENSION RANGE OF MOTION AND FLEXIBILITY EXERCISES: ° °STRETCH - Extension, Prone on Elbows  °Lie on your stomach on the floor, a bed will be too soft. Place your palms about shoulder width apart and at the height of your head. °Place your elbows under your shoulders. If this  is too painful, stack pillows under your chest. °Allow your body to relax so that your hips drop lower and make contact more completely with the floor. °Hold this position for 30 seconds. °Slowly return to lying flat on the floor. °Repeat 2 times. Complete this exercise 3 times per week.  ° °RANGE OF MOTION - Extension, Prone Press Ups °Lie on your stomach on the floor, a bed will be too soft. Place your palms about shoulder width apart and at the height of your head. °Keeping your back as relaxed as possible, slowly straighten your elbows while keeping your hips on the floor. You may adjust the placement of your hands to maximize your comfort. As you gain motion, your hands will come more underneath your shoulders. °Hold this position 30 seconds. °Slowly return to lying flat on the floor. °Repeat 2 times. Complete this exercise 3 times per week.  ° °RANGE OF MOTION- Quadruped, Neutral Spine  °Assume a hands and knees position on a firm surface. Keep your hands under your shoulders and your knees under your hips. You may place padding under your knees for comfort. °Drop your head and point your tailbone toward the ground below you. This will round out your lower back like an angry cat. Hold this position for 30 seconds. °Slowly lift your head and release your tail bone so that your back sags into a large arch, like an old horse. °Hold this position for 30 seconds. °Repeat this until you feel limber in your low back. °Now, find your "sweet spot." This will be the most comfortable position somewhere between the two previous positions. This is your neutral spine. Once you have found this position, tense your stomach muscles to support your low back. °Hold this position for 30 seconds. °Repeat 2 times. Complete this exercise 3 times per week.  ° °STRENGTHENING EXERCISES - Low Back Sprain °These exercises may help you when beginning to rehabilitate your injury. These exercises should be done near your "sweet spot." This  is the neutral, low-back arch, somewhere between fully rounded and fully arched, that is your least painful position. When performed in this safe range of motion, these exercises can be used for people who have either a flexion or extension based injury. These exercises may resolve your symptoms with or without further involvement from your physician, physical therapist or athletic trainer. While completing these exercises, remember:  °Muscles can gain both the endurance and the strength needed for everyday activities through controlled exercises. °Complete these exercises as instructed by your physician, physical therapist or athletic trainer. Increase the resistance and repetitions only as guided. °You may experience muscle soreness or fatigue, but the pain or discomfort you are trying to eliminate should never worsen during these exercises. If this pain does worsen, stop and make certain you are following the directions exactly. If the pain is still present after adjustments, discontinue the exercise until you can discuss the trouble with your caregiver. ° °  STRENGTHENING - Deep Abdominals, Pelvic Tilt  °Lie on a firm bed or floor. Keeping your legs in front of you, bend your knees so they are both pointed toward the ceiling and your feet are flat on the floor. °Tense your lower abdominal muscles to press your low back into the floor. This motion will rotate your pelvis so that your tail bone is scooping upwards rather than pointing at your feet or into the floor. °With a gentle tension and even breathing, hold this position for 3 seconds. °Repeat 2 times. Complete this exercise 3 times per week.  ° °STRENGTHENING - Abdominals, Crunches  °Lie on a firm bed or floor. Keeping your legs in front of you, bend your knees so they are both pointed toward the ceiling and your feet are flat on the floor. Cross your arms over your chest. °Slightly tip your chin down without bending your neck. °Tense your abdominals and  slowly lift your trunk high enough to just clear your shoulder blades. Lifting higher can put excessive stress on the lower back and does not further strengthen your abdominal muscles. °Control your return to the starting position. °Repeat 2 times. Complete this exercise 3 times per week.  ° °STRENGTHENING - Quadruped, Opposite UE/LE Lift  °Assume a hands and knees position on a firm surface. Keep your hands under your shoulders and your knees under your hips. You may place padding under your knees for comfort. °Find your neutral spine and gently tense your abdominal muscles so that you can maintain this position. Your shoulders and hips should form a rectangle that is parallel with the floor and is not twisted. °Keeping your trunk steady, lift your right hand no higher than your shoulder and then your left leg no higher than your hip. Make sure you are not holding your breath. Hold this position for 30 seconds. °Continuing to keep your abdominal muscles tense and your back steady, slowly return to your starting position. Repeat with the opposite arm and leg. °Repeat 2 times. Complete this exercise 3 times per week.  ° °STRENGTHENING - Abdominals and Quadriceps, Straight Leg Raise  °Lie on a firm bed or floor with both legs extended in front of you. °Keeping one leg in contact with the floor, bend the other knee so that your foot can rest flat on the floor. °Find your neutral spine, and tense your abdominal muscles to maintain your spinal position throughout the exercise. °Slowly lift your straight leg off the floor about 6 inches for a count of 3, making sure to not hold your breath. °Still keeping your neutral spine, slowly lower your leg all the way to the floor. °Repeat this exercise with each leg 2 times. Complete this exercise 3 times per week. ° °POSTURE AND BODY MECHANICS CONSIDERATIONS - Low Back Sprain °Keeping correct posture when sitting, standing or completing your activities will reduce the stress put  on different body tissues, allowing injured tissues a chance to heal and limiting painful experiences. The following are general guidelines for improved posture.  While reading these guidelines, remember: °The exercises prescribed by your provider will help you have the flexibility and strength to maintain correct postures. °The correct posture provides the best environment for your joints to work. All of your joints have less wear and tear when properly supported by a spine with good posture. This means you will experience a healthier, less painful body. °Correct posture must be practiced with all of your activities, especially prolonged sitting and standing. Correct   posture is as important when doing repetitive low-stress activities (typing) as it is when doing a single heavy-load activity (lifting). ° °RESTING POSITIONS °Consider which positions are most painful for you when choosing a resting position. If you have pain with flexion-based activities (sitting, bending, stooping, squatting), choose a position that allows you to rest in a less flexed posture. You would want to avoid curling into a fetal position on your side. If your pain worsens with extension-based activities (prolonged standing, working overhead), avoid resting in an extended position such as sleeping on your stomach. Most people will find more comfort when they rest with their spine in a more neutral position, neither too rounded nor too arched. Lying on a non-sagging bed on your side with a pillow between your knees, or on your back with a pillow under your knees will often provide some relief. Keep in mind, being in any one position for a prolonged period of time, no matter how correct your posture, can still lead to stiffness. ° °PROPER SITTING POSTURE °In order to minimize stress and discomfort on your spine, you must sit with correct posture. Sitting with good posture should be effortless for a healthy body. Returning to good posture is a  gradual process. Many people can work toward this most comfortably by using various supports until they have the flexibility and strength to maintain this posture on their own. °When sitting with proper posture, your ears will fall over your shoulders and your shoulders will fall over your hips. You should use the back of the chair to support your upper back. Your lower back will be in a neutral position, just slightly arched. You may place a small pillow or folded towel at the base of your lower back for  °support.  °When working at a desk, create an environment that supports good, upright posture. Without extra support, muscles tire, which leads to excessive strain on joints and other tissues. Keep these recommendations in mind: ° °CHAIR: °A chair should be able to slide under your desk when your back makes contact with the back of the chair. This allows you to work closely. °The chair's height should allow your eyes to be level with the upper part of your monitor and your hands to be slightly lower than your elbows. ° °BODY POSITION °Your feet should make contact with the floor. If this is not possible, use a foot rest. °Keep your ears over your shoulders. This will reduce stress on your neck and low back. ° °INCORRECT SITTING POSTURES  °If you are feeling tired and unable to assume a healthy sitting posture, do not slouch or slump. This puts excessive strain on your back tissues, causing more damage and pain. Healthier options include: °Using more support, like a lumbar pillow. °Switching tasks to something that requires you to be upright or walking. °Talking a brief walk. °Lying down to rest in a neutral-spine position. ° °PROLONGED STANDING WHILE SLIGHTLY LEANING FORWARD  °When completing a task that requires you to lean forward while standing in one place for a long time, place either foot up on a stationary 2-4 inch high object to help maintain the best posture. When both feet are on the ground, the lower  back tends to lose its slight inward curve. If this curve flattens (or becomes too large), then the back and your other joints will experience too much stress, tire more quickly, and can cause pain. ° °CORRECT STANDING POSTURES °Proper standing posture should be assumed with all   daily activities, even if they only take a few moments, like when brushing your teeth. As in sitting, your ears should fall over your shoulders and your shoulders should fall over your hips. You should keep a slight tension in your abdominal muscles to brace your spine. Your tailbone should point down to the ground, not behind your body, resulting in an over-extended swayback posture.  ° °INCORRECT STANDING POSTURES  °Common incorrect standing postures include a forward head, locked knees and/or an excessive swayback. °WALKING °Walk with an upright posture. Your ears, shoulders and hips should all line-up. ° °PROLONGED ACTIVITY IN A FLEXED POSITION °When completing a task that requires you to bend forward at your waist or lean over a low surface, try to find a way to stabilize 3 out of 4 of your limbs. You can place a hand or elbow on your thigh or rest a knee on the surface you are reaching across. This will provide you more stability, so that your muscles do not tire as quickly. By keeping your knees relaxed, or slightly bent, you will also reduce stress across your lower back. °CORRECT LIFTING TECHNIQUES ° °DO : °Assume a wide stance. This will provide you more stability and the opportunity to get as close as possible to the object which you are lifting. °Tense your abdominals to brace your spine. Bend at the knees and hips. Keeping your back locked in a neutral-spine position, lift using your leg muscles. Lift with your legs, keeping your back straight. °Test the weight of unknown objects before attempting to lift them. °Try to keep your elbows locked down at your sides in order get the best strength from your shoulders when carrying an  object. ° ° °Always ask for help when lifting heavy or awkward objects. °INCORRECT LIFTING TECHNIQUES °DO NOT:  °Lock your knees when lifting, even if it is a small object. °Bend and twist. Pivot at your feet or move your feet when needing to change directions. °Assume that you can safely pick up even a paperclip without proper posture. ° °

## 2019-04-20 ENCOUNTER — Encounter: Payer: Self-pay | Admitting: Family Medicine

## 2019-04-20 ENCOUNTER — Ambulatory Visit (INDEPENDENT_AMBULATORY_CARE_PROVIDER_SITE_OTHER): Payer: PPO | Admitting: Family Medicine

## 2019-04-20 ENCOUNTER — Other Ambulatory Visit: Payer: Self-pay

## 2019-04-20 ENCOUNTER — Telehealth: Payer: Self-pay

## 2019-04-20 DIAGNOSIS — J302 Other seasonal allergic rhinitis: Secondary | ICD-10-CM

## 2019-04-20 DIAGNOSIS — H938X3 Other specified disorders of ear, bilateral: Secondary | ICD-10-CM | POA: Diagnosis not present

## 2019-04-20 DIAGNOSIS — R42 Dizziness and giddiness: Secondary | ICD-10-CM | POA: Diagnosis not present

## 2019-04-20 MED ORDER — MECLIZINE HCL 25 MG PO TABS: 25.0000 mg | ORAL_TABLET | Freq: Three times a day (TID) | ORAL | 0 refills | Status: DC | PRN

## 2019-04-20 MED ORDER — FLUTICASONE PROPIONATE 50 MCG/ACT NA SUSP: 2.0000 | Freq: Every day | NASAL | 2 refills | Status: DC

## 2019-04-20 MED ORDER — ONDANSETRON 4 MG PO TBDP: 4.0000 mg | ORAL_TABLET | Freq: Three times a day (TID) | ORAL | 0 refills | Status: DC | PRN

## 2019-04-20 NOTE — Telephone Encounter (Signed)
Copied from [place] [id number]. Topic: Appointment Scheduling - Scheduling Inquiry for Clinic >> Apr 19, 2019  4:35 PM Alanda Slim E wrote: Reason for CRM: Pt would like an appt for reoccurring vertigo/ please advise

## 2019-04-20 NOTE — Telephone Encounter (Signed)
Scheduled a virtual visit today.

## 2019-04-20 NOTE — Progress Notes (Signed)
Chief Complaint  Patient presents with  . Dizziness    reocurring  . Nausea    Robin Maldonado is 68 y.o. pt here for dizziness. Due to COVID-19 pandemic, we are interacting via phone. I verified patient's ID using 2 identifiers. Patient agreed to proceed with visit via this method. Patient is at home, I am at office. Patient and I are present for visit.    Duration:several mo, off and on Pass out? No Spinning? Yes; exacerbated by movement, lasts for around 1 d (never stops) Recent illness/fever? No Headache? No Neurologic signs? No Change in PO intake? No  No new meds +allergies and ear fullness, has not been using nasal spray Denies vision changes.   ROS:  Neuro: As noted in HPI Eyes: No vision changes  Past Medical History:  Diagnosis Date  . No known health problems     Family History  Problem Relation Age of Onset  . Cancer Mother 32       colon  . Heart disease Father   . Stroke Neg Hx   . Diabetes Neg Hx   . Hypertension Neg Hx     Allergies as of 04/20/2019   No Known Allergies     Medication List       Accurate as of April 20, 2019 11:48 AM. If you have any questions, ask your nurse or doctor.        STOP taking these medications   cyclobenzaprine 5 MG tablet Commonly known as: FLEXERIL Stopped by: [name], DO   methylPREDNISolone 4 MG Tbpk tablet Commonly known as: MEDROL DOSEPAK Stopped by: [name], DO     TAKE these medications   fluticasone 50 MCG/ACT nasal spray Commonly known as: FLONASE Place 2 sprays into both nostrils daily. Started by: [name], DO   meclizine 25 MG tablet Commonly known as: ANTIVERT Take 1 tablet (25 mg total) by mouth 3 (three) times daily as needed for dizziness. Started by: [name], DO   ondansetron 4 MG disintegrating tablet Commonly known as: ZOFRAN-ODT Take 1 tablet (4 mg total) by mouth every 8 (eight) hours as needed for nausea or vomiting.  Started by: [name], DO      Exam No conversational dyspnea Age appropriate judgment and insight Nml affect and mood   Vertigo - Plan: Ambulatory referral to Neurology, ondansetron (ZOFRAN-ODT) 4 MG disintegrating tablet, meclizine (ANTIVERT) 25 MG tablet, I am concerned that her "spinning" will last for such long periods of time, even after movement ceases. She is not actively having any vertigo. Will refer to Neuro for their opinion.   Sensation of fullness in both ears - Plan: Ambulatory referral to ENT, fluticasone (FLONASE) 50 MCG/ACT nasal spray; she would also like balance issue evaluated by ENT.  Seasonal allergies - Plan: fluticasone (FLONASE) 50 MCG/ACT nasal spray  Total time spent: 11 min F/u prn. Pt voiced understanding and agreement to the plan.  [place], DO 04/20/19 11:48 AM

## 2019-04-21 ENCOUNTER — Telehealth: Payer: Self-pay | Admitting: Diagnostic Neuroimaging

## 2019-04-21 NOTE — Telephone Encounter (Signed)
Pt gave consent for VV on the phone/ Pt understands that although there may be some limitations with this type of visit, we will take all precautions to reduce any security or privacy concerns.  Pt understands that this will be treated like an in office visit and we will file with pt's insurance, and there may be a patient responsible charge related to this service. Sent email with link to [email or web address]

## 2019-04-22 ENCOUNTER — Encounter: Payer: Self-pay | Admitting: Diagnostic Neuroimaging

## 2019-04-22 NOTE — Telephone Encounter (Signed)
Spoke with patient and updated EMR. 

## 2019-04-23 ENCOUNTER — Telehealth: Payer: Self-pay

## 2019-04-23 NOTE — Telephone Encounter (Signed)
Copied from [place] [id number]. Topic: Referral - Status >> Apr 22, 2019  4:10 PM Erick Blinks wrote: Reason for CRM: Gay Filler from Dr. Pollie Friar office called to report that hearing test must be performed before pt can be scheduled. Also, pt's insurance is not accepted. Please advise

## 2019-04-23 NOTE — Telephone Encounter (Signed)
Gwen- can you send ENT referral to Dr. Benjamine Mola- I don't think he is [facility]- do you need a new referral placed?

## 2019-04-23 NOTE — Telephone Encounter (Signed)
Patient states she will not be able be seen with Dr. Poll[facility] Friar office until July. She is requesting referral be sent to new office (none specified, patient just stated she prefers "not [facility]").

## 2019-04-26 ENCOUNTER — Ambulatory Visit (INDEPENDENT_AMBULATORY_CARE_PROVIDER_SITE_OTHER): Payer: PPO | Admitting: Diagnostic Neuroimaging

## 2019-04-26 ENCOUNTER — Encounter: Payer: Self-pay | Admitting: Diagnostic Neuroimaging

## 2019-04-26 ENCOUNTER — Telehealth: Payer: Self-pay | Admitting: Diagnostic Neuroimaging

## 2019-04-26 ENCOUNTER — Other Ambulatory Visit: Payer: Self-pay

## 2019-04-26 DIAGNOSIS — H814 Vertigo of central origin: Secondary | ICD-10-CM | POA: Diagnosis not present

## 2019-04-26 NOTE — Progress Notes (Signed)
[facility]  PATIENT: Robin Maldonado DOB: 10/13/1951  REFERRING CLINICIAN: Nani Ravens HISTORY FROM: patient  REASON FOR VISIT: new consult    HISTORICAL  CHIEF COMPLAINT:  Chief Complaint  Patient presents with  . Dizziness    HISTORY OF PRESENT ILLNESS:   68 year old female here for evaluation of vertigo.  For past 5 years patient has had intermittent episodes of positional vertigo, typically she looks to the right or down.  In the last few months episode of increased in severity and frequency.  Episodes can last all day.  Sometimes he has nausea vomiting.  No vision changes, slurred speech, tinnitus, numbness or hearing loss.  No specific triggering factors.  Patient does have history of migraines, but does not have headaches with current attack.  Patient used to work in physical therapy and therefore tried Printmaker on herself which did not help.  Patient has tried Zofran and meclizine without relief.  Patient has referral to ENT pending.    REVIEW OF SYSTEMS: Full 14 system review of systems performed and negative with exception of: As per HPI.  ALLERGIES: No Known Allergies  HOME MEDICATIONS: Outpatient Medications Prior to Visit  Medication Sig Dispense Refill  . fluticasone (FLONASE) 50 MCG/ACT nasal spray Place 2 sprays into both nostrils daily. 16 g 2  . meclizine (ANTIVERT) 25 MG tablet Take 1 tablet (25 mg total) by mouth 3 (three) times daily as needed for dizziness. 30 tablet 0  . ondansetron (ZOFRAN-ODT) 4 MG disintegrating tablet Take 1 tablet (4 mg total) by mouth every 8 (eight) hours as needed for nausea or vomiting. (Patient not taking: Reported on 04/22/2019) 20 tablet 0   No facility-administered medications prior to visit.     PAST MEDICAL HISTORY: Past Medical History:  Diagnosis Date  . No known health problems     PAST SURGICAL HISTORY: Past Surgical History:  Procedure Laterality Date  . CRYOTHERAPY       FAMILY HISTORY: Family History  Problem Relation Age of Onset  . Cancer Mother 71       colon  . Heart disease Father   . Stroke Brother   . Diabetes Neg Hx   . Hypertension Neg Hx     SOCIAL HISTORY: Social History   Socioeconomic History  . Marital status: Widowed    Spouse name: Not on file  . Number of children: 1  . Years of education: Not on file  . Highest education level: Some college, no degree  Occupational History    Comment: retired Physicist, medical  Social Needs  . Financial resource strain: Not on file  . Food insecurity    Worry: Not on file    Inability: Not on file  . Transportation needs    Medical: Not on file    Non-medical: Not on file  Tobacco Use  . Smoking status: Never Smoker  . Smokeless tobacco: Never Used  Substance and Sexual Activity  . Alcohol use: No    Alcohol/week: 0.0 standard drinks  . Drug use: No  . Sexual activity: Never  Lifestyle  . Physical activity    Days per week: Not on file    Minutes per session: Not on file  . Stress: Not on file  Relationships  . Social Herbalist on phone: Not on file    Gets together: Not on file    Attends religious service: Not on file    Active member of club or organization:  Not on file    Attends meetings of clubs or organizations: Not on file    Relationship status: Not on file  . Intimate partner violence    Fear of current or ex partner: Not on file    Emotionally abused: Not on file    Physically abused: Not on file    Forced sexual activity: Not on file  Other Topics Concern  . Not on file  Social History Narrative   Lives alone   Caffeine- 16 oz tea, 8 oz soda but not daily     PHYSICAL EXAM  VIDEO EXAM  GENERAL EXAM/CONSTITUTIONAL:  Vitals: There were no vitals filed for this visit.  There is no height or weight on file to calculate BMI. Wt Readings from Last 3 Encounters:  01/01/19 133 lb 2 oz (60.4 kg)  12/01/18 132 lb 12.8 oz (60.2 kg)  08/27/18  133 lb (60.3 kg)     Patient is in no distress; well developed, nourished and groomed; neck is supple   NEUROLOGIC: MENTAL STATUS:  No flowsheet data found.  awake, alert, oriented to person, place and time  recent and remote memory intact  normal attention and concentration  language fluent, comprehension intact, naming intact  fund of knowledge appropriate  CRANIAL NERVE:   2nd, 3rd, 4th, 6th - visual fields full to confrontation, extraocular muscles intact, no nystagmus  5th - facial sensation symmetric  7th - facial strength symmetric  8th - hearing intact  11th - shoulder shrug symmetric  12th - tongue protrusion midline  MOTOR:   NO TREMOR; NO DRIFT IN BUE  SENSORY:   normal and symmetric to light touch  COORDINATION:   fine finger movements normal   DIAGNOSTIC DATA (LABS, IMAGING, TESTING) - I reviewed patient records, labs, notes, testing and imaging myself where available.  Lab Results  Component Value Date   WBC 10.3 08/08/2017   HGB 13.2 08/08/2017   HCT 40.8 08/08/2017   MCV 94.4 08/08/2017   PLT 277 08/08/2017      Component Value Date/Time   NA 139 08/08/2017 1925   K 3.5 08/08/2017 1925   CL 105 08/08/2017 1925   CO2 26 08/08/2017 1925   GLUCOSE 123 (H) 08/08/2017 1925   BUN 11 08/08/2017 1925   CREATININE 0.74 08/08/2017 1925   CREATININE 0.89 01/29/2016 0803   CALCIUM 9.1 08/08/2017 1925   PROT 7.4 08/08/2017 1925   ALBUMIN 3.9 08/08/2017 1925   AST 20 08/08/2017 1925   ALT 21 08/08/2017 1925   ALKPHOS 99 08/08/2017 1925   BILITOT 0.5 08/08/2017 1925   GFRNONAA >60 08/08/2017 1925   GFRAA >60 08/08/2017 1925   Lab Results  Component Value Date   CHOL 240 (H) 01/29/2016   HDL 89 01/29/2016   LDLCALC 140 (H) 01/29/2016   TRIG 54 01/29/2016   CHOLHDL 2.7 01/29/2016   No results found for: HGBA1C No results found for: VITAMINB12 Lab Results  Component Value Date   TSH 1.289 08/08/2017   12/01/18 CXR [I reviewed  images myself and agree with interpretation. -VRP]  - No evidence of active disease.   ASSESSMENT AND PLAN  68 y.o. year old female here with intermittent positional vertigo, lasting hours or days at a time since 2015.  Worse in the last 3 to 6 months.   Dx:  1. Vertigo of central origin    Virtual Visit via Video Note  I connected with Robin Maldonado on 04/26/19 at  2:00 PM EDT  by a video enabled telemedicine application and verified that I am speaking with the correct person using two identifiers.  Location: Patient: home  Provider: office    I discussed the limitations of evaluation and management by telemedicine and the availability of in person appointments. The patient expressed understanding and agreed to proceed.  I discussed the assessment and treatment plan with the patient. The patient was provided an opportunity to ask questions and all were answered. The patient agreed with the plan and demonstrated an understanding of the instructions.   The patient was advised to call back or seek an in-person evaluation if the symptoms worsen or if the condition fails to improve as anticipated.  I provided 30 minutes of non-face-to-face time during this encounter.   PLAN:  POSITIONAL VERTIGO (long lasting; rule out central causes) - check MRI brain and IAC - follow up with ENT  Orders Placed This Encounter  Procedures  . MR BRAIN/IAC W WO CONTRAST   Return pending test results, for pending if symptoms worsen or fail to improve.    Penni Bombard, MD 02/10/8118, 1:47 PM Certified in Neurology, Neurophysiology and Neuroimaging  [facility] [address] [place], [place] [address] [phone]

## 2019-04-26 NOTE — Telephone Encounter (Signed)
health team order sent to GI. No auth they will reach out to the patient to schedule.  

## 2019-06-04 ENCOUNTER — Other Ambulatory Visit: Payer: Self-pay

## 2019-06-07 ENCOUNTER — Other Ambulatory Visit (HOSPITAL_BASED_OUTPATIENT_CLINIC_OR_DEPARTMENT_OTHER): Payer: Self-pay | Admitting: Family Medicine

## 2019-06-07 DIAGNOSIS — Z1231 Encounter for screening mammogram for malignant neoplasm of breast: Secondary | ICD-10-CM

## 2019-06-08 ENCOUNTER — Ambulatory Visit (HOSPITAL_BASED_OUTPATIENT_CLINIC_OR_DEPARTMENT_OTHER)
Admission: RE | Admit: 2019-06-08 | Discharge: 2019-06-08 | Disposition: A | Discharge status: Home / Self Care | Payer: PPO | Source: Ambulatory Visit | Attending: Family Medicine | Admitting: Family Medicine

## 2019-06-08 ENCOUNTER — Other Ambulatory Visit: Payer: Self-pay

## 2019-06-08 ENCOUNTER — Telehealth: Payer: Self-pay | Admitting: *Deleted

## 2019-06-08 ENCOUNTER — Encounter (HOSPITAL_BASED_OUTPATIENT_CLINIC_OR_DEPARTMENT_OTHER): Payer: Self-pay

## 2019-06-08 ENCOUNTER — Other Ambulatory Visit: Payer: Self-pay | Admitting: *Deleted

## 2019-06-08 DIAGNOSIS — Z1231 Encounter for screening mammogram for malignant neoplasm of breast: Secondary | ICD-10-CM | POA: Insufficient documentation

## 2019-06-08 DIAGNOSIS — H938X3 Other specified disorders of ear, bilateral: Secondary | ICD-10-CM

## 2019-06-08 NOTE — Telephone Encounter (Signed)
Patient called and had a question about her ENT referral that was placed back in June.  It looks like at that point she did not want to see anyone in [facility].  Now she is ok with being seen at [facility] at [facility].  New referral placed.

## 2019-06-10 DIAGNOSIS — L821 Other seborrheic keratosis: Secondary | ICD-10-CM | POA: Diagnosis not present

## 2019-06-10 DIAGNOSIS — L820 Inflamed seborrheic keratosis: Secondary | ICD-10-CM | POA: Diagnosis not present

## 2019-06-16 DIAGNOSIS — R2681 Unsteadiness on feet: Secondary | ICD-10-CM | POA: Diagnosis not present

## 2019-06-16 DIAGNOSIS — R05 Cough: Secondary | ICD-10-CM | POA: Diagnosis not present

## 2019-06-16 DIAGNOSIS — H938X3 Other specified disorders of ear, bilateral: Secondary | ICD-10-CM | POA: Diagnosis not present

## 2019-07-15 ENCOUNTER — Ambulatory Visit: Payer: PPO | Admitting: Obstetrics & Gynecology

## 2019-08-16 NOTE — Progress Notes (Signed)
Virtual Visit via Video Note  I connected with patient on 08/17/19 at  2:45 PM EDT by audio enabled telemedicine application and verified that I am speaking with the correct person using two identifiers.   THIS ENCOUNTER IS A VIRTUAL VISIT DUE TO COVID-19 - PATIENT WAS NOT SEEN IN THE OFFICE. PATIENT HAS CONSENTED TO VIRTUAL VISIT / TELEMEDICINE VISIT   Location of patient: home  Location of provider: office  I discussed the limitations of evaluation and management by telemedicine and the availability of in person appointments. The patient expressed understanding and agreed to proceed.   Subjective:   Robin Maldonado is a 68 y.o. female who presents for Medicare Annual (Subsequent) preventive examination.  Retired from [employer] after 45 yrs  Review of Systems:  Home Safety/Smoke Alarms: Feels safe in home. Smoke alarms in place.  Lives with daughter, son in law, and grandson.   Female:       Mammo- 06/08/19      Dexa scan-   Declines at this time.     CCS- 05/04/18     Objective:     Vitals:  Unable to assess. This visit is enabled though telemedicine due to Covid 19.   Advanced Directives 08/17/2019 08/08/2017  Does Patient Have a Medical Advance Directive? Yes No  Type of Paramedic of [place];Living will -  Does patient want to make changes to medical advance directive? No - Patient declined -  Copy of [place] in Chart? No - copy requested -    Tobacco Social History   Tobacco Use  Smoking Status Never Smoker  Smokeless Tobacco Never Used     Counseling given: Not Answered   Clinical Intake:     Pain : No/denies pain                 Past Medical History:  Diagnosis Date  . No known health problems    Past Surgical History:  Procedure Laterality Date  . CRYOTHERAPY     Family History  Problem Relation Age of Onset  . Cancer Mother 26       colon  . Heart disease Father   . Stroke  Brother   . Diabetes Neg Hx   . Hypertension Neg Hx    Social History   Socioeconomic History  . Marital status: Widowed    Spouse name: Not on file  . Number of children: 1  . Years of education: Not on file  . Highest education level: Some college, no degree  Occupational History    Comment: retired Physicist, medical  Social Needs  . Financial resource strain: Not on file  . Food insecurity    Worry: Not on file    Inability: Not on file  . Transportation needs    Medical: Not on file    Non-medical: Not on file  Tobacco Use  . Smoking status: Never Smoker  . Smokeless tobacco: Never Used  Substance and Sexual Activity  . Alcohol use: No    Alcohol/week: 0.0 standard drinks  . Drug use: No  . Sexual activity: Never  Lifestyle  . Physical activity    Days per week: Not on file    Minutes per session: Not on file  . Stress: Not on file  Relationships  . Social Herbalist on phone: Not on file    Gets together: Not on file    Attends religious service: Not on file  Active member of club or organization: Not on file    Attends meetings of clubs or organizations: Not on file    Relationship status: Not on file  Other Topics Concern  . Not on file  Social History Narrative   Lives alone   Caffeine- 16 oz tea, 8 oz soda but not daily    Outpatient Encounter Medications as of 08/17/2019  Medication Sig  . fluticasone (FLONASE) 50 MCG/ACT nasal spray Place 2 sprays into both nostrils daily.  . meclizine (ANTIVERT) 25 MG tablet Take 1 tablet (25 mg total) by mouth 3 (three) times daily as needed for dizziness.  . ondansetron (ZOFRAN-ODT) 4 MG disintegrating tablet Take 1 tablet (4 mg total) by mouth every 8 (eight) hours as needed for nausea or vomiting.   No facility-administered encounter medications on file as of 08/17/2019.     Activities of Daily Living In your present state of health, do you have any difficulty performing the following activities:  08/17/2019  Hearing? N  Vision? N  Difficulty concentrating or making decisions? N  Walking or climbing stairs? N  Dressing or bathing? N  Doing errands, shopping? N  Preparing Food and eating ? N  Using the Toilet? N  In the past six months, have you accidently leaked urine? N  Do you have problems with loss of bowel control? N  Managing your Medications? N  Managing your Finances? N  Housekeeping or managing your Housekeeping? N  Some recent data might be hidden    Patient Care Team: [name], DO as PCP - General (Family Medicine)    Assessment:   This is a routine wellness examination for Robin Maldonado. Physical assessment deferred to PCP.  Exercise Activities and Dietary recommendations Current Exercise Habits: Home exercise routine, Type of exercise: walking, Time (Minutes): 40, Frequency (Times/Week): 6, Weekly Exercise (Minutes/Week): 240, Intensity: Mild, Exercise limited by: None identified Diet (meal preparation, eat out, water intake, caffeinated beverages, dairy products, fruits and vegetables): well balanced   Goals    . Patient Stated     Maintain healthy active lifestyle.       Fall Risk Fall Risk  08/17/2019  Falls in the past year? 0  Number falls in past yr: 0  Injury with Fall? 0      Depression Screen PHQ 2/9 Scores 08/17/2019  PHQ - 2 Score 0     Cognitive Function Ad8 score reviewed for issues:  Issues making decisions:no  Less interest in hobbies / activities:no  Repeats questions, stories (family complaining):no  Trouble using ordinary gadgets (microwave, computer, phone):no  Forgets the month or year: no  Mismanaging finances: no  Remembering appts:no  Daily problems with thinking and/or memory:no Ad8 score is=0         There is no immunization history on file for this patient.    Screening Tests Health Maintenance  Topic Date Due  . TETANUS/TDAP  01/20/1971  . DEXA SCAN  01/19/2017  . PNA vac Low Risk  Adult (1 of 2 - PCV13) 01/19/2017  . INFLUENZA VACCINE  06/05/2019  . MAMMOGRAM  06/07/2021  . COLONOSCOPY  05/04/2028  . Hepatitis C Screening  Completed      Plan:   See you next year!  Continue to eat heart healthy diet (full of fruits, vegetables, whole grains, lean protein, water--limit salt, fat, and sugar intake) and increase physical activity as tolerated.  Continue doing brain stimulating activities (puzzles, reading, adult coloring books, staying active) to keep memory sharp.  Bring a copy of your living will and/or healthcare power of attorney to your next office visit.    I have personally reviewed and noted the following in the patient's chart:   . Medical and social history . Use of alcohol, tobacco or illicit drugs  . Current medications and supplements . Functional ability and status . Nutritional status . Physical activity . Advanced directives . List of other physicians . Hospitalizations, surgeries, and ER visits in previous 12 months . Vitals . Screenings to include cognitive, depression, and falls . Referrals and appointments  In addition, I have reviewed and discussed with patient certain preventive protocols, quality metrics, and best practice recommendations. A written personalized care plan for preventive services as well as general preventive health recommendations were provided to patient.     Shela Nevin, [place]  08/17/2019

## 2019-08-17 ENCOUNTER — Encounter: Payer: Self-pay | Admitting: *Deleted

## 2019-08-17 ENCOUNTER — Ambulatory Visit (INDEPENDENT_AMBULATORY_CARE_PROVIDER_SITE_OTHER): Payer: PPO | Admitting: *Deleted

## 2019-08-17 ENCOUNTER — Other Ambulatory Visit: Payer: Self-pay

## 2019-08-17 DIAGNOSIS — Z0000 Encounter for general adult medical examination without abnormal findings: Secondary | ICD-10-CM | POA: Diagnosis not present

## 2019-08-17 NOTE — Patient Instructions (Signed)
See you next year!  Continue to eat heart healthy diet (full of fruits, vegetables, whole grains, lean protein, water--limit salt, fat, and sugar intake) and increase physical activity as tolerated.  Continue doing brain stimulating activities (puzzles, reading, adult coloring books, staying active) to keep memory sharp.   Bring a copy of your living will and/or healthcare power of attorney to your next office visit.   Robin Maldonado , Thank you for taking time to come for your Medicare Wellness Visit. I appreciate your ongoing commitment to your health goals. Please review the following plan we discussed and let me know if I can assist you in the future.   These are the goals we discussed: Goals    . Patient Stated     Maintain healthy active lifestyle.       This is a list of the screening recommended for you and due dates:  Health Maintenance  Topic Date Due  . Tetanus Vaccine  01/20/1971  . DEXA scan (bone density measurement)  01/19/2017  . Pneumonia vaccines (1 of 2 - PCV13) 01/19/2017  . Flu Shot  06/05/2019  . Mammogram  06/07/2021  . Colon Cancer Screening  05/04/2028  .  Hepatitis C: One time screening is recommended by Center for Disease Control  (CDC) for  adults born from [birth year] through 1965.   Completed    Health Maintenance After Age 60 After age 46, you are at a higher risk for certain long-term diseases and infections as well as injuries from falls. Falls are a major cause of broken bones and head injuries in people who are older than age 88. Getting regular preventive care can help to keep you healthy and well. Preventive care includes getting regular testing and making lifestyle changes as recommended by your health care provider. Talk with your health care provider about:  Which screenings and tests you should have. A screening is a test that checks for a disease when you have no symptoms.  A diet and exercise plan that is right for you. What should I know about  screenings and tests to prevent falls? Screening and testing are the best ways to find a health problem early. Early diagnosis and treatment give you the best chance of managing medical conditions that are common after age 66. Certain conditions and lifestyle choices may make you more likely to have a fall. Your health care provider may recommend:  Regular vision checks. Poor vision and conditions such as cataracts can make you more likely to have a fall. If you wear glasses, make sure to get your prescription updated if your vision changes.  Medicine review. Work with your health care provider to regularly review all of the medicines you are taking, including over-the-counter medicines. Ask your health care provider about any side effects that may make you more likely to have a fall. Tell your health care provider if any medicines that you take make you feel dizzy or sleepy.  Osteoporosis screening. Osteoporosis is a condition that causes the bones to get weaker. This can make the bones weak and cause them to break more easily.  Blood pressure screening. Blood pressure changes and medicines to control blood pressure can make you feel dizzy.  Strength and balance checks. Your health care provider may recommend certain tests to check your strength and balance while standing, walking, or changing positions.  Foot health exam. Foot pain and numbness, as well as not wearing proper footwear, can make you more likely to have a  fall.  Depression screening. You may be more likely to have a fall if you have a fear of falling, feel emotionally low, or feel unable to do activities that you used to do.  Alcohol use screening. Using too much alcohol can affect your balance and may make you more likely to have a fall. What actions can I take to lower my risk of falls? General instructions  Talk with your health care provider about your risks for falling. Tell your health care provider if: ? You fall. Be sure  to tell your health care provider about all falls, even ones that seem minor. ? You feel dizzy, sleepy, or off-balance.  Take over-the-counter and prescription medicines only as told by your health care provider. These include any supplements.  Eat a healthy diet and maintain a healthy weight. A healthy diet includes low-fat dairy products, low-fat (lean) meats, and fiber from whole grains, beans, and lots of fruits and vegetables. Home safety  Remove any tripping hazards, such as rugs, cords, and clutter.  Install safety equipment such as grab bars in bathrooms and safety rails on stairs.  Keep rooms and walkways well-lit. Activity   Follow a regular exercise program to stay fit. This will help you maintain your balance. Ask your health care provider what types of exercise are appropriate for you.  If you need a cane or walker, use it as recommended by your health care provider.  Wear supportive shoes that have nonskid soles. Lifestyle  Do not drink alcohol if your health care provider tells you not to drink.  If you drink alcohol, limit how much you have: ? 0-1 drink a day for women. ? 0-2 drinks a day for men.  Be aware of how much alcohol is in your drink. In the [place] one drink equals one typical bottle of beer (12 oz), one-half glass of wine (5 oz), or one shot of hard liquor (1 oz).  Do not use any products that contain nicotine or tobacco, such as cigarettes and e-cigarettes. If you need help quitting, ask your health care provider. Summary  Having a healthy lifestyle and getting preventive care can help to protect your health and wellness after age 16.  Screening and testing are the best way to find a health problem early and help you avoid having a fall. Early diagnosis and treatment give you the best chance for managing medical conditions that are more common for people who are older than age 70.  Falls are a major cause of broken bones and head injuries in people  who are older than age 29. Take precautions to prevent a fall at home.  Work with your health care provider to learn what changes you can make to improve your health and wellness and to prevent falls. This information is not intended to replace advice given to you by your health care provider. Make sure you discuss any questions you have with your health care provider. Document Released: 09/03/2017 Document Revised: 02/11/2019 Document Reviewed: 09/03/2017 Elsevier Patient Education  2020 [employer].

## 2019-09-13 ENCOUNTER — Telehealth: Payer: Self-pay

## 2019-09-13 NOTE — Telephone Encounter (Signed)
Copied from [place] [id number]. Topic: General - Inquiry >> Sep 10, 2019  5:43 PM Robin Maldonado wrote: Reason for CRM: Patient is wanting to be prescribed a medication from a dog bite . Please advise

## 2019-09-13 NOTE — Telephone Encounter (Signed)
Called the patient to request she schedule an appt, but evidently the dog bite is improving and she thinks ok and needs no appt/ or medication.  Putting peroxide and neosporin on it and said looks much better.

## 2019-10-27 DIAGNOSIS — H2513 Age-related nuclear cataract, bilateral: Secondary | ICD-10-CM | POA: Diagnosis not present

## 2020-01-13 ENCOUNTER — Telehealth: Payer: Self-pay | Admitting: Family Medicine

## 2020-01-13 NOTE — Telephone Encounter (Signed)
Patient went to Dentist today and had an xray, Dentist told patient that she had a lot of congestion and build up in nasal cavity.   Please Advise

## 2020-01-14 ENCOUNTER — Ambulatory Visit (INDEPENDENT_AMBULATORY_CARE_PROVIDER_SITE_OTHER): Payer: PPO | Admitting: Medical

## 2020-01-14 ENCOUNTER — Other Ambulatory Visit: Payer: Self-pay

## 2020-01-14 DIAGNOSIS — J329 Chronic sinusitis, unspecified: Secondary | ICD-10-CM | POA: Diagnosis not present

## 2020-01-14 MED ORDER — AMOXICILLIN-POT CLAVULANATE 875-125 MG PO TABS: 1.0000 | ORAL_TABLET | Freq: Two times a day (BID) | ORAL | 0 refills | Status: DC

## 2020-01-14 MED ORDER — CEFTRIAXONE SODIUM 1 G IJ SOLR
1.0000 g | Freq: Once | INTRAMUSCULAR | Status: AC
  Administered 2020-01-14: 11:00:00 1 g via INTRAMUSCULAR

## 2020-01-14 NOTE — Patient Instructions (Signed)
You describe signs/symptoms of sinus infection with xray done at dentist which he read as highly suspicious for infection.  Will give you rocephin 1 gram in office today. Rx augmentin antibiotic and have you start flonase.  Follow up in 10 days or as needed

## 2020-01-14 NOTE — Progress Notes (Signed)
   Subjective:    Patient ID: Robin Maldonado, female    DOB: [birth year], 69 y.o.   MRN: [id number]  HPI  Virtual Visit via Telephone Note  I connected with Robin Maldonado on 01/14/20 at  9:40 AM EST by telephone and verified that I am speaking with the correct person using two identifiers.  Location: Patient: home Provider: office   I discussed the limitations, risks, security and privacy concerns of performing an evaluation and management service by telephone and the availability of in person appointments. I also discussed with the patient that there may be a patient responsible charge related to this service. The patient expressed understanding and agreed to proceed.   History of Present Illness:   5 days of rt side teeth pain, sinus pressure over rt maxillary sinus and clogged/blocked appearance by xray when done by her dentist yesterday for her teeth discomfort. No tooth infection found. Pt sent my chart message with xray but I don't see that yet.  Pt does have hx of sinus infection.  Not blowing out any colored mucus.  Per dentist report sinus looked completely clogged up by xray.  On review no covid type symptoms see ros.     Observations/Objective: General- no acute distress, pleasant, alert and oriented.   Assessment and Plan: You describe signs/symptoms of sinus infection with xray done at dentist which he read as highly suspicious for infection.  Will give you rocephin 1 gram in office today. Rx augmentin antibiotic and have you start flonase.  Follow up in 10 days or as needed  Follow Up Instructions:    I discussed the assessment and treatment plan with the patient. The patient was provided an opportunity to ask questions and all were answered. The patient agreed with the plan and demonstrated an understanding of the instructions.   The patient was advised to call back or seek an in-person evaluation if the symptoms worsen or if the condition fails to  improve as anticipated.  I provided 25 minutes of non-face-to-face time during this encounter.   Mackie Pai, PA-C    Review of Systems     Objective:   Physical Exam        Assessment & Plan:

## 2020-01-14 NOTE — Addendum Note (Signed)
Addended by: Jeronimo Greaves on: 01/14/2020 11:21 AM   Modules accepted: Orders

## 2020-01-14 NOTE — Telephone Encounter (Signed)
Called the patient left message to call /// needs to schedule virtual visit.

## 2020-01-26 ENCOUNTER — Ambulatory Visit: Payer: PPO | Admitting: Medical

## 2020-03-24 DIAGNOSIS — L821 Other seborrheic keratosis: Secondary | ICD-10-CM | POA: Diagnosis not present

## 2020-03-24 DIAGNOSIS — L820 Inflamed seborrheic keratosis: Secondary | ICD-10-CM | POA: Diagnosis not present

## 2020-05-18 ENCOUNTER — Other Ambulatory Visit: Payer: Self-pay | Admitting: Family Medicine

## 2020-05-18 DIAGNOSIS — J302 Other seasonal allergic rhinitis: Secondary | ICD-10-CM

## 2020-05-18 DIAGNOSIS — H938X3 Other specified disorders of ear, bilateral: Secondary | ICD-10-CM

## 2020-06-05 ENCOUNTER — Other Ambulatory Visit (HOSPITAL_BASED_OUTPATIENT_CLINIC_OR_DEPARTMENT_OTHER): Payer: Self-pay | Admitting: Family Medicine

## 2020-06-05 DIAGNOSIS — Z1231 Encounter for screening mammogram for malignant neoplasm of breast: Secondary | ICD-10-CM

## 2020-06-06 DIAGNOSIS — L820 Inflamed seborrheic keratosis: Secondary | ICD-10-CM | POA: Diagnosis not present

## 2020-06-08 ENCOUNTER — Other Ambulatory Visit: Payer: Self-pay

## 2020-06-08 ENCOUNTER — Ambulatory Visit (HOSPITAL_BASED_OUTPATIENT_CLINIC_OR_DEPARTMENT_OTHER)
Admission: RE | Admit: 2020-06-08 | Discharge: 2020-06-08 | Disposition: A | Discharge status: Home / Self Care | Payer: PPO | Source: Ambulatory Visit | Attending: Family Medicine | Admitting: Family Medicine

## 2020-06-08 DIAGNOSIS — Z1231 Encounter for screening mammogram for malignant neoplasm of breast: Secondary | ICD-10-CM | POA: Diagnosis not present

## 2020-08-25 ENCOUNTER — Ambulatory Visit: Payer: PPO | Admitting: Family Medicine

## 2020-08-29 ENCOUNTER — Other Ambulatory Visit: Payer: Self-pay

## 2020-08-29 ENCOUNTER — Encounter: Payer: Self-pay | Admitting: Family Medicine

## 2020-08-29 ENCOUNTER — Other Ambulatory Visit: Payer: Self-pay | Admitting: Family Medicine

## 2020-08-29 ENCOUNTER — Ambulatory Visit (INDEPENDENT_AMBULATORY_CARE_PROVIDER_SITE_OTHER): Payer: PPO | Admitting: Family Medicine

## 2020-08-29 VITALS — BP 110/68 | HR 95 | Temp 97.9°F | Ht 65.0 in | Wt 133.2 lb

## 2020-08-29 DIAGNOSIS — L290 Pruritus ani: Secondary | ICD-10-CM | POA: Diagnosis not present

## 2020-08-29 DIAGNOSIS — L299 Pruritus, unspecified: Secondary | ICD-10-CM | POA: Diagnosis not present

## 2020-08-29 MED ORDER — HYDROCORTISONE 2.5 % EX CREA: TOPICAL_CREAM | Freq: Two times a day (BID) | CUTANEOUS | 0 refills | Status: DC

## 2020-08-29 MED FILL — HYDROCORTISONE 2.5% CREAM: 2.5 | 15 days supply | Qty: 30 | Fill #0

## 2020-08-29 NOTE — Progress Notes (Signed)
Chief Complaint  Robin Maldonado presents with  . Follow-up    parasites??    Subjective: Robin Maldonado is a 69 y.o. female here for anal itching.  Over the past week, the Robin Maldonado has had intense anal itching.  She is tried over-the-counter Preparation H with minimal relief.  She has also tried Benadryl.  She wonders if she has pinworms.  She does have cats but always is careful with emptying the litter box.  She lives with other family members, but they did not have any symptoms.  No recent travel, new foods, or new medications.  No new topicals.  No pain or drainage.  She denies any abdominal pain, nausea, vomiting, or bowel changes.  She has not noticed anything in her stool.    Past Medical History:  Diagnosis Date  . No known health problems     Objective: BP 110/68 (BP Location: Right Arm, Robin Maldonado Position: Sitting, Cuff Size: Normal)   Pulse 95   Temp 97.9 F (36.6 C) (Oral)   Ht 5\' 5"  (1.651 m)   Wt 133 lb 4 oz (60.4 kg)   SpO2 100%   BMI 22.17 kg/m  General: Awake, appears stated age Abdomen: Soft, nontender, nondistended, bowel sounds present, no masses or organomegaly Skin: There is a slightly scaly and excoriated patch on the right flank without erythema, skin change, ecchymosis, warmth, or fluctuance. Heart: RRR Lungs: CTAB, no rales, wheezes or rhonchi. No accessory muscle use Psych: Age appropriate judgment and insight, normal affect and mood  Assessment and Plan: Anal pruritus - Plan: hydrocortisone 2.5 % cream  Pruritic dermatitis - Plan: hydrocortisone 2.5 % cream  Orders as above.  She was instructed to get over-the-counter pyrantel pamoate for a single dose.  If no improvement in next 48-72 hours, she will let me know and I will send in albendazole. The Robin Maldonado voiced understanding and agreement to the plan.  [place], DO 08/29/20  11:56 AM

## 2020-08-29 NOTE — Patient Instructions (Addendum)
Pin-X or Reese's Pinworm (pyrantel pamoate) is available over the counter.   Let me know on Thursday or Friday if we aren't turning the corner.  Try not to itch.   Cold/ice can be helpful with the itch.    Let us know if you need anything.

## 2020-08-31 ENCOUNTER — Telehealth: Payer: Self-pay | Admitting: Family Medicine

## 2020-08-31 NOTE — Telephone Encounter (Signed)
Patient states over the counter medication did not work. She is wondering if you could send the medication you suggested in the visit.  [id number] - [place], [place] - [address]  [place], [place] [address]  Phone:  [phone] Fax:  [phone]

## 2020-09-01 MED ORDER — ALBENDAZOLE 200 MG PO TABS: ORAL_TABLET | ORAL | 0 refills | Status: DC

## 2020-09-01 NOTE — Telephone Encounter (Signed)
Called left message prescription has been sent in. Unable to speak to the patient

## 2020-09-01 NOTE — Telephone Encounter (Signed)
As she has failed Pin-X otc, will send in albendazole. Ty.

## 2020-09-01 NOTE — Telephone Encounter (Signed)
Called left message to call back 

## 2020-10-05 ENCOUNTER — Ambulatory Visit: Payer: PPO

## 2020-11-24 ENCOUNTER — Encounter: Payer: Self-pay | Admitting: Internal Medicine

## 2020-11-24 ENCOUNTER — Other Ambulatory Visit: Payer: Self-pay

## 2020-11-24 ENCOUNTER — Telehealth (INDEPENDENT_AMBULATORY_CARE_PROVIDER_SITE_OTHER): Payer: PPO | Admitting: Internal Medicine

## 2020-11-24 VITALS — Ht 65.0 in | Wt 123.0 lb

## 2020-11-24 DIAGNOSIS — U071 COVID-19: Secondary | ICD-10-CM

## 2020-11-24 NOTE — Progress Notes (Signed)
Subjective:    Patient ID: Robin Maldonado, female    DOB: 07-24-1951, 70 y.o.   MRN: [id number]  DOS:  11/24/2020 Type of visit - description: Virtual Visit via Video Note  I connected with the above patient  by a video enabled telemedicine application and verified that I am speaking with the correct person using two identifiers.   THIS ENCOUNTER IS A VIRTUAL VISIT DUE TO COVID-19 - PATIENT WAS NOT SEEN IN THE OFFICE. PATIENT HAS CONSENTED TO VIRTUAL VISIT / TELEMEDICINE VISIT   Location of patient: home  Location of provider: office  Persons participating in the virtual visit: patient, provider   I discussed the limitations of evaluation and management by telemedicine and the availability of in person appointments. The patient expressed understanding and agreed to proceed.  Acute Symptoms a started 6 days ago: Fatigue, frontal headache, mild cough, mild aches. She was exposed to [place] few days before, was tested 11/22/2020 : Covid  positive.  Today, she feels a slightly stronger and has less cough but has a persistent, on and off headache, located at the "bridge of the nose". No global headache, not the worst HA of her life, she has occasional nausea (that is getting better) but the nausea does not come along with a headache.  Tylenol does help the HA.    Review of Systems No chest pain no difficulty breathing No edema No diarrhea. No rash  Past Medical History:  Diagnosis Date  . No known health problems     Past Surgical History:  Procedure Laterality Date  . CRYOTHERAPY      Allergies as of 11/24/2020      Reactions   Hydrocodone-acetaminophen Nausea And Vomiting      Medication List       Accurate as of November 24, 2020 11:59 PM. If you have any questions, ask your nurse or doctor.        STOP taking these medications   albendazole 200 MG tablet Commonly known as: ALBENZA Stopped by: Kathlene November, MD   amoxicillin-clavulanate 875-125 MG  tablet Commonly known as: Augmentin Stopped by: Kathlene November, MD   fluticasone 50 MCG/ACT nasal spray Commonly known as: FLONASE Stopped by: Kathlene November, MD   hydrocortisone 2.5 % cream Stopped by: Kathlene November, MD   meclizine 25 MG tablet Commonly known as: ANTIVERT Stopped by: Kathlene November, MD   ondansetron 4 MG disintegrating tablet Commonly known as: ZOFRAN-ODT Stopped by: Kathlene November, MD          Objective:   Physical Exam Ht 5\' 5"  (1.651 m)   Wt 123 lb (55.8 kg)   BMI 20.47 kg/m  This is a virtual video visit, no vital signs available, alert, oriented, she seems to be walking around her house without problems.  Face is symmetric and no motor deficits noted, moves the eyes without problems.   Assessment    COVID-19 The patient is 70, unvaccinated, presents with COVID-19. Overall symptoms seem mild, and the main symptom today is a frontal headache. She denies sinus discharge or congestion, Tylenol helps. Plan: Rec conservative treatment with fluids, Tylenol, Mucinex DM.  Denies the need for antinausea medicine. If not gradually better call If severe headache she needs to reach out or go to the ER. Because she is unvaccinated and on day 6 of illness she qualifies for infusion, referral sent, she is aware that supply is very limited. Also advised her to be vaccinated once she is better, she remains skeptical  I discussed the assessment and treatment plan with the patient. The patient was provided an opportunity to ask questions and all were answered. The patient agreed with the plan and demonstrated an understanding of the instructions.   The patient was advised to call back or seek an in-person evaluation if the symptoms worsen or if the condition fails to improve as anticipated.

## 2020-11-24 NOTE — Progress Notes (Signed)
Pre visit review using our clinic review tool, if applicable. No additional management support is needed unless otherwise documented below in the visit note. 

## 2020-11-26 ENCOUNTER — Encounter: Payer: Self-pay | Admitting: Infectious Diseases

## 2020-11-28 ENCOUNTER — Telehealth: Payer: Self-pay | Admitting: Family Medicine

## 2020-11-28 NOTE — Telephone Encounter (Signed)
Patient's daughter called stating the her mother has covid and does not feel any better  Patient was referred to infustion clinic and would like an update.

## 2020-11-28 NOTE — Telephone Encounter (Signed)
Called the patient and informed referral was done and they will call the patient if she meets their criteria. The patient is still having headaches, fatigue and fever.  Not a great appetite Scheduled a telephone visit with Dr. Nani Ravens tomorrow to discuss her symptoms/what else she can be doing/.taking.that may help her.

## 2020-11-29 ENCOUNTER — Encounter: Payer: Self-pay | Admitting: Family Medicine

## 2020-11-29 ENCOUNTER — Telehealth (INDEPENDENT_AMBULATORY_CARE_PROVIDER_SITE_OTHER): Payer: PPO | Admitting: Family Medicine

## 2020-11-29 ENCOUNTER — Other Ambulatory Visit: Payer: Self-pay

## 2020-11-29 VITALS — Temp 98.4°F

## 2020-11-29 DIAGNOSIS — U071 COVID-19: Secondary | ICD-10-CM

## 2020-11-29 MED ORDER — PREDNISONE 20 MG PO TABS: 40.0000 mg | ORAL_TABLET | Freq: Every day | ORAL | 0 refills | Status: AC

## 2020-11-29 NOTE — Progress Notes (Signed)
Chief Complaint  Patient presents with  . Covid Positive  . Fatigue    Unable to get through taking a shower, Not eating/or drinking well. Fever started 2 nights ago. 100.5 and 100.7 Sinus and eye pain comes and goes. Sleeping constantly Has been sick 14 days    Lucylle Foulkes here for URI complaints. Due to COVID-19 pandemic, we are interacting via telephone. I verified patient's ID using 2 identifiers. Patient agreed to proceed with visit via this method. Patient is at home, I am at office. Patient, her daughter and I are present for visit.   Duration: 14 days  Associated symptoms: Fever (100.7 F yesterday), sinus pain, rhinorrhea, intermittent myalgias and fatigue Denies: cough, sob, wheezing, N/V/D, ST, nasal congestion, ear pain/drainage, itchy/water eyes Treatment to date: aspirin, Excedrin Migraine Sick contacts: Yes- family members Tested + for covid on 11/22/20 She is unvaccinated.   Past Medical History:  Diagnosis Date  . No known health problems    Objective Temp 98.4 F (36.9 C) (Oral)  No conversational dyspnea Age appropriate judgment and insight Nml affect and mood  COVID-19 - Plan: predniSONE (DELTASONE) 20 MG tablet  5 d pred burst 40 mg/d. Tylenol.  Continue to push fluids, practice good hand hygiene, cover mouth when coughing. F/u prn. If starting to experience fevers, shaking, or shortness of breath, seek immediate care. Total time: 11 min Pt voiced understanding and agreement to the plan.  [place], DO 11/29/20 10:48 AM

## 2021-05-22 ENCOUNTER — Telehealth: Payer: Self-pay | Admitting: Family Medicine

## 2021-05-22 NOTE — Telephone Encounter (Signed)
Copied from [place] [id number]. Topic: Medicare AWV >> May 22, 2021  9:45 AM Harris[place]Coley, Hannah Beat wrote: Reason for CRM: Left message for patient to schedule Annual Wellness Visit.  Please schedule with Health Nurse Advisor Augustine Radar. at [facility].

## 2021-06-05 ENCOUNTER — Other Ambulatory Visit (HOSPITAL_BASED_OUTPATIENT_CLINIC_OR_DEPARTMENT_OTHER): Payer: Self-pay

## 2021-06-05 ENCOUNTER — Ambulatory Visit (INDEPENDENT_AMBULATORY_CARE_PROVIDER_SITE_OTHER): Payer: PPO | Admitting: Family

## 2021-06-05 ENCOUNTER — Other Ambulatory Visit: Payer: Self-pay

## 2021-06-05 VITALS — BP 110/60 | HR 83 | Temp 98.4°F | Ht 65.0 in | Wt 132.2 lb

## 2021-06-05 DIAGNOSIS — B372 Candidiasis of skin and nail: Secondary | ICD-10-CM

## 2021-06-05 DIAGNOSIS — L290 Pruritus ani: Secondary | ICD-10-CM

## 2021-06-05 MED ORDER — FLUCONAZOLE 100 MG PO TABS
100.0000 mg | ORAL_TABLET | Freq: Every day | ORAL | 0 refills | Status: DC
  Filled 2021-06-05: qty 7, 7d supply, fill #0

## 2021-06-05 NOTE — Progress Notes (Signed)
  Robin Maldonado is a 70 y.o. female with the following history as recorded in EpicCare:  Patient Active Problem List   Diagnosis Date Noted   Refused Streptococcus pneumoniae vaccination 02/04/2018   Tetanus toxoid vaccination refused 02/04/2018   Recommendation refused by patient 02/04/2018   Right knee injury, subsequent encounter 09/23/2017   SHOULDER PAIN, RIGHT 07/14/2009    Current Outpatient Medications  Medication Sig Dispense Refill   fluconazole (DIFLUCAN) 100 MG tablet Take 1 tablet (100 mg total) by mouth daily. 7 tablet 0   No current facility-administered medications for this visit.    Allergies: Hydrocodone-acetaminophen  Past Medical History:  Diagnosis Date   No known health problems     Past Surgical History:  Procedure Laterality Date   CRYOTHERAPY      Family History  Problem Relation Age of Onset   Cancer Mother 23       colon   Heart disease Father    Stroke Brother    Diabetes Neg Hx    Hypertension Neg Hx     Social History   Tobacco Use   Smoking status: Never   Smokeless tobacco: Never  Substance Use Topics   Alcohol use: No    Alcohol/week: 0.0 standard drinks    Subjective:  Recurrent problems with anal itching for the past 3 years; notes that intermittent episodes "on and off" for the past 3 years; was treated with topical steroids and treated for possible worms in October 2021- notes that symptoms did not clear completely;  Patient denies any changes in bowel movements- is due for follow up colonoscopy later this year; Patient is questioning possibility of yeast infection; denies any recent food changes or history of food allergies;    Objective:  Vitals:   06/05/21 1336  BP: 110/60  Pulse: 83  Temp: 98.4 F (36.9 C)  TempSrc: Oral  SpO2: 98%  Weight: 132 lb 3.2 oz (60 kg)  Height: $Remove'5\' 5"'ImMaWXO$  (1.651 m)    General: Well developed, well nourished, in no acute distress  Skin : Warm and dry.  Head: Normocephalic and atraumatic   Lungs: Respirations unlabored;  Neurologic: Alert and oriented; speech intact; face symmetrical; moves all extremities well; CNII-XII intact without focal deficit  Rectal exam- no obvious abnormality noted; mild erythema noted;  Assessment:  1. Yeast dermatitis   2. Anal pruritus     Plan:  ? Yeast; check CBC, CMP, Hgba1c today; Rx for Diflucan 100 mg qd x 7 days; she will call her GI to schedule follow up; may also need to consider allergist for possible food allergy testing or proctologist for further evaluation;   This visit occurred during the SARS-CoV-2 public health emergency.  Safety protocols were in place, including screening questions prior to the visit, additional usage of staff PPE, and extensive cleaning of exam room while observing appropriate contact time as indicated for disinfecting solutions.    No follow-ups on file.  Orders Placed This Encounter  Procedures   Comp Met (CMET)   CBC with Differential/Platelet   Hemoglobin A1c    Requested Prescriptions   Signed Prescriptions Disp Refills   fluconazole (DIFLUCAN) 100 MG tablet 7 tablet 0    Sig: Take 1 tablet (100 mg total) by mouth daily.

## 2021-06-06 LAB — COMPREHENSIVE METABOLIC PANEL
ALT: 9 U/L (ref 0–35)
AST: 15 U/L (ref 0–37)
Albumin: 4.3 g/dL (ref 3.5–5.2)
Alkaline Phosphatase: 104 U/L (ref 39–117)
BUN: 15 mg/dL (ref 6–23)
CO2: 28 mEq/L (ref 19–32)
Calcium: 9.2 mg/dL (ref 8.4–10.5)
Chloride: 102 mEq/L (ref 96–112)
Creatinine, Ser: 0.83 mg/dL (ref 0.40–1.20)
GFR: 71.95 mL/min (ref >60.00)
Glucose, Bld: 109 mg/dL — ABNORMAL HIGH (ref 70–99)
Potassium: 4.6 mEq/L (ref 3.5–5.1)
Sodium: 140 mEq/L (ref 135–145)
Total Bilirubin: 0.4 mg/dL (ref 0.2–1.2)
Total Protein: 6.8 g/dL (ref 6.0–8.3)

## 2021-06-06 LAB — CBC WITH DIFFERENTIAL/PLATELET
Basophils Absolute: 0.1 10*3/uL (ref 0.0–0.1)
Basophils Relative: 1 % (ref 0.0–3.0)
Eosinophils Absolute: 0.1 10*3/uL (ref 0.0–0.7)
Eosinophils Relative: 1.1 % (ref 0.0–5.0)
HCT: 40.3 % (ref 36.0–46.0)
Hemoglobin: 13.4 g/dL (ref 12.0–15.0)
Lymphocytes Relative: 27.8 % (ref 12.0–46.0)
Lymphs Abs: 1.7 10*3/uL (ref 0.7–4.0)
MCHC: 33.3 g/dL (ref 30.0–36.0)
MCV: 92.7 fl (ref 78.0–100.0)
Monocytes Absolute: 0.4 10*3/uL (ref 0.1–1.0)
Monocytes Relative: 5.9 % (ref 3.0–12.0)
Neutro Abs: 3.8 10*3/uL (ref 1.4–7.7)
Neutrophils Relative %: 64.2 % (ref 43.0–77.0)
Platelets: 274 10*3/uL (ref 150.0–400.0)
RBC: 4.35 Mil/uL (ref 3.87–5.11)
RDW: 14 % (ref 11.5–15.5)
WBC: 6 10*3/uL (ref 4.0–10.5)

## 2021-06-06 LAB — HEMOGLOBIN A1C: Hgb A1c MFr Bld: 6.2 % (ref 4.6–6.5)

## 2021-06-09 ENCOUNTER — Encounter: Payer: Self-pay | Admitting: Family

## 2021-06-11 ENCOUNTER — Encounter: Payer: Self-pay | Admitting: Family

## 2021-06-15 ENCOUNTER — Other Ambulatory Visit (HOSPITAL_BASED_OUTPATIENT_CLINIC_OR_DEPARTMENT_OTHER): Payer: Self-pay | Admitting: Family Medicine

## 2021-06-15 DIAGNOSIS — Z1231 Encounter for screening mammogram for malignant neoplasm of breast: Secondary | ICD-10-CM

## 2021-07-03 ENCOUNTER — Ambulatory Visit (INDEPENDENT_AMBULATORY_CARE_PROVIDER_SITE_OTHER): Payer: PPO | Admitting: Family Medicine

## 2021-07-03 ENCOUNTER — Other Ambulatory Visit: Payer: Self-pay

## 2021-07-03 ENCOUNTER — Encounter: Payer: Self-pay | Admitting: Family Medicine

## 2021-07-03 VITALS — BP 118/76 | HR 85 | Temp 98.2°F | Ht 65.0 in | Wt 130.4 lb

## 2021-07-03 DIAGNOSIS — E2839 Other primary ovarian failure: Secondary | ICD-10-CM | POA: Diagnosis not present

## 2021-07-03 DIAGNOSIS — Z23 Encounter for immunization: Secondary | ICD-10-CM | POA: Diagnosis not present

## 2021-07-03 DIAGNOSIS — Z0000 Encounter for general adult medical examination without abnormal findings: Secondary | ICD-10-CM | POA: Diagnosis not present

## 2021-07-03 DIAGNOSIS — M546 Pain in thoracic spine: Secondary | ICD-10-CM

## 2021-07-03 MED ORDER — DICLOFENAC SODIUM 1 % EX GEL: 2.0000 g | Freq: Four times a day (QID) | CUTANEOUS | 1 refills | Status: DC

## 2021-07-03 MED ORDER — METHYLPREDNISOLONE ACETATE 40 MG/ML IJ SUSP
20.0000 mg | Freq: Once | INTRAMUSCULAR | Status: AC
Start: 2021-07-03 — End: 2021-07-03
  Administered 2021-07-03: 20 mg via INTRA_ARTICULAR

## 2021-07-03 NOTE — Progress Notes (Signed)
Chief Complaint  Patient presents with   Annual Exam    Form completed     Well Woman Robin Maldonado is here for a complete physical.   Her last physical was >1 year ago.  Current diet: in general, diet is fair. Current exercise: walking. Weight is stable and she denies daytime fatigue. Seatbelt? Yes  Health Maintenance Colonoscopy- Yes Shingrix- No DEXA- No Mammogram- Yes Tetanus- No Pneumonia- No Hep C screen- Yes  Trigger point Long hx of trigger point over R scapular region. Has tried needling, acupuncture, NSAIDs some stretching without relief. Interested in topical NSAIDs and trigger point injections. 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   Past Medical History:  Diagnosis Date   No known health problems      Past Surgical History:  Procedure Laterality Date   CRYOTHERAPY      Medications  Takes no meds routinely.     Allergies Allergies  Allergen Reactions   Hydrocodone-Acetaminophen Nausea And Vomiting    Review of Systems: Constitutional:  no fevers Eye:  no recent significant change in vision Ears:  No changes in hearing Nose/Mouth/Throat:  no complaints of nasal congestion, no sore throat Cardiovascular: no chest pain Respiratory:  No shortness of breath Gastrointestinal:  No change in bowel habits GU:  Female: negative for dysuria Integumentary:  no abnormal skin lesions reported Neurologic:  no headaches Endocrine:  denies unexplained weight changes  Exam BP 118/76   Pulse 85   Temp 98.2 F (36.8 C) (Oral)   Ht '5\' 5"'$  (1.651 m)   Wt 130 lb 6 oz (59.1 kg)   SpO2 96%   BMI 21.70 kg/m  General:  well developed, well nourished, in no apparent distress Skin:  no significant moles, warts, or growths Head:  no masses, lesions, or tenderness Eyes:  pupils equal and round, sclera anicteric without injection MSK: TTP w knotted muscle over rhomboid on R Ears:  canals without lesions, TMs shiny without  retraction, no obvious effusion, no erythema Nose:  nares patent, septum midline, mucosa normal, and no drainage or sinus tenderness Throat/Pharynx:  lips and gingiva without lesion; tongue and uvula midline; non-inflamed pharynx; no exudates or postnasal drainage Neck: neck supple without adenopathy, thyromegaly, or masses Lungs:  clear to auscultation, breath sounds equal bilaterally, no respiratory distress Cardio:  regular rate and rhythm, no bruits or LE edema Abdomen:  abdomen soft, nontender; bowel sounds normal; no masses or organomegaly Genital: Deferred Neuro:  gait normal; deep tendon reflexes normal and symmetric Psych: well oriented with normal range of affect and appropriate judgment/insight  Procedure note; trigger point injection Verbal consent obtained. The areas of interests were demarcated with an otoscope speculum tip. There were then cleaned with alcohol and topical freeze spray was used. 0.75 mL were injected at each trigger point with a concoction of 20 mg depo (10 mg per trigger point) and 1 mL of 2% lido w/o epi (0.5 mL per trigger point) The areas were then bandaged. There were no complications noted. The patient tolerated the procedure well.  Assessment and Plan  Well adult exam - Plan: Lipid panel  Estrogen deficiency - Plan: DG Bone Density  Trigger point of thoracic region - Plan: diclofenac Sodium (VOLTAREN) 1 % GEL   Well 70 y.o. female. Counseled on diet and exercise. DEXA ordered. Tdap today. Declined Shingrix and PCV20. Flu shot rec'd for mid Oct.  Trigger point: Topical NSAID, heat, ice, trigger point injection  x 2 today. Consider PT vs sports med referral if no improvement.  Other orders as above. Follow up in 1 yr or prn. The patient voiced understanding and agreement to the plan.  [place], DO 07/03/21 3:33 PM

## 2021-07-03 NOTE — Patient Instructions (Addendum)
Give [place] 2-3 business days to get the results of your labs back.   Keep the diet clean and stay active.  Aim to do some physical exertion for 150 minutes per week. This is typically divided into 5 days per week, 30 minutes per day. The activity should be enough to get your heart rate up. Anything is better than nothing if you have time constraints.  I recommend getting the flu shot in mid October. This suggestion would change if the CDC comes out with a different recommendation.   Take at least 1000 units of vitamin D3 daily.   Voltaren gel is available over the counter as well.   The new Shingrix vaccine (for shingles) is a 2 shot series. It can make people feel low energy, achy and almost like they have the flu for 48 hours after injection. Please plan accordingly when deciding on when to get this shot. Call our office for a nurse visit appointment to get this. The second shot of the series is less severe regarding the side effects, but it still lasts 48 hours.   Let me know if you change your mind about the pneumonia vaccine.   Let us know if you need anything.

## 2021-07-04 LAB — LIPID PANEL
Cholesterol: 245 mg/dL — ABNORMAL HIGH (ref 0–200)
HDL: 77.8 mg/dL (ref >39.00)
LDL Cholesterol: 144 mg/dL — ABNORMAL HIGH (ref 0–99)
NonHDL: 166.92
Total CHOL/HDL Ratio: 3
Triglycerides: 116 mg/dL (ref 0.0–149.0)
VLDL: 23.2 mg/dL (ref 0.0–40.0)

## 2021-07-17 DIAGNOSIS — Z20822 Contact with and (suspected) exposure to covid-19: Secondary | ICD-10-CM | POA: Diagnosis not present

## 2021-07-17 DIAGNOSIS — M791 Myalgia, unspecified site: Secondary | ICD-10-CM | POA: Diagnosis not present

## 2021-07-17 DIAGNOSIS — R0981 Nasal congestion: Secondary | ICD-10-CM | POA: Diagnosis not present

## 2021-07-17 DIAGNOSIS — R051 Acute cough: Secondary | ICD-10-CM | POA: Diagnosis not present

## 2021-07-31 ENCOUNTER — Encounter (HOSPITAL_BASED_OUTPATIENT_CLINIC_OR_DEPARTMENT_OTHER): Payer: Self-pay

## 2021-07-31 ENCOUNTER — Other Ambulatory Visit: Payer: Self-pay

## 2021-07-31 ENCOUNTER — Ambulatory Visit (HOSPITAL_BASED_OUTPATIENT_CLINIC_OR_DEPARTMENT_OTHER)
Admission: RE | Admit: 2021-07-31 | Discharge: 2021-07-31 | Disposition: A | Discharge status: Home / Self Care | Payer: PPO | Source: Ambulatory Visit | Attending: Family Medicine | Admitting: Family Medicine

## 2021-07-31 DIAGNOSIS — Z1231 Encounter for screening mammogram for malignant neoplasm of breast: Secondary | ICD-10-CM | POA: Diagnosis not present

## 2021-08-02 ENCOUNTER — Ambulatory Visit (HOSPITAL_BASED_OUTPATIENT_CLINIC_OR_DEPARTMENT_OTHER)
Admission: RE | Admit: 2021-08-02 | Discharge: 2021-08-02 | Disposition: A | Discharge status: Home / Self Care | Payer: PPO | Source: Ambulatory Visit | Attending: Family Medicine | Admitting: Family Medicine

## 2021-08-02 ENCOUNTER — Other Ambulatory Visit: Payer: Self-pay

## 2021-08-02 DIAGNOSIS — E2839 Other primary ovarian failure: Secondary | ICD-10-CM | POA: Diagnosis not present

## 2021-08-02 DIAGNOSIS — M810 Age-related osteoporosis without current pathological fracture: Secondary | ICD-10-CM | POA: Diagnosis not present

## 2021-08-24 ENCOUNTER — Encounter: Payer: Self-pay | Admitting: Family Medicine

## 2021-08-24 ENCOUNTER — Other Ambulatory Visit: Payer: Self-pay

## 2021-08-24 ENCOUNTER — Ambulatory Visit (INDEPENDENT_AMBULATORY_CARE_PROVIDER_SITE_OTHER): Payer: PPO | Admitting: Family Medicine

## 2021-08-24 VITALS — BP 104/66 | HR 75 | Temp 97.9°F | Resp 18 | Ht 65.0 in | Wt 130.0 lb

## 2021-08-24 DIAGNOSIS — M810 Age-related osteoporosis without current pathological fracture: Secondary | ICD-10-CM | POA: Diagnosis not present

## 2021-08-24 DIAGNOSIS — E559 Vitamin D deficiency, unspecified: Secondary | ICD-10-CM | POA: Diagnosis not present

## 2021-08-24 DIAGNOSIS — E569 Vitamin deficiency, unspecified: Secondary | ICD-10-CM | POA: Diagnosis not present

## 2021-08-24 LAB — VITAMIN D 25 HYDROXY (VIT D DEFICIENCY, FRACTURES): Vit D, 25-Hydroxy: 30 ng/mL (ref 30–100)

## 2021-08-24 NOTE — Patient Instructions (Addendum)
Give [place] 2-3 business days to get the results of your labs back.   If Vit D is normal, we will start Fosamax.  Keep the diet clean and stay active.  We may eventually consider a coronary artery calcium score to help determine if a statin medication is appropriate.   The new Shingrix vaccine (for shingles) is a 2 shot series. It can make people feel low energy, achy and almost like they have the flu for 48 hours after injection. Please plan accordingly when deciding on when to get this shot. Call our office for a nurse visit appointment to get this. The second shot of the series is less severe regarding the side effects, but it still lasts 48 hours.   Let us know if you need anything.

## 2021-08-24 NOTE — Progress Notes (Signed)
Chief Complaint  Patient presents with   Imaging    Pt here to discuss DEXA scan results    Subjective: Patient is a 70 y.o. female here for results f/u.  Patient had a bone density scan that showed osteoporosis.  She does not take calcium or vitamin D due to constipation from the calcium.  She is working again and is active at work.  Past Medical History:  Diagnosis Date   No known health problems     Objective: BP 104/66 (BP Location: Right Arm, Patient Position: Sitting, Cuff Size: Normal)   Pulse 75   Temp 97.9 F (36.6 C) (Oral)   Resp 18   Ht 5\' 5"  (1.651 m)   Wt 130 lb (59 kg)   SpO2 98%   BMI 21.63 kg/m  General: Awake, appears stated age Lungs: No accessory muscle use Psych: Age appropriate judgment and insight, normal affect and mood  Assessment and Plan: Age-related osteoporosis without current pathological fracture - Plan: VITAMIN D 25 Hydroxy (Vit-D Deficiency, Fractures)  Check vitamin D, if within normal limits, we will recommend amount of calcium and vitamin D.  We will also send in Fosamax 70 mg weekly.  Recheck in 6 months.  We also briefly discussed her cholesterol.  I do not believe the Framingham risk calculator is accurate for her age.  She will focus on diet and exercise and will consider coronary artery calcium scoring at her next visit. The patient voiced understanding and agreement to the plan.  [place], DO 08/24/21  4:39 PM

## 2021-08-25 ENCOUNTER — Other Ambulatory Visit: Payer: Self-pay | Admitting: Family Medicine

## 2021-08-25 MED ORDER — ALENDRONATE SODIUM 70 MG PO TABS
70.0000 mg | ORAL_TABLET | ORAL | 11 refills | Status: DC
Start: 2021-08-25 — End: 2022-08-06

## 2021-09-06 ENCOUNTER — Other Ambulatory Visit (HOSPITAL_BASED_OUTPATIENT_CLINIC_OR_DEPARTMENT_OTHER): Payer: Self-pay

## 2021-09-17 DIAGNOSIS — Z800 Family history of malignant neoplasm of digestive organs: Secondary | ICD-10-CM | POA: Diagnosis not present

## 2021-09-17 DIAGNOSIS — D120 Benign neoplasm of cecum: Secondary | ICD-10-CM | POA: Diagnosis not present

## 2021-09-17 DIAGNOSIS — D122 Benign neoplasm of ascending colon: Secondary | ICD-10-CM | POA: Diagnosis not present

## 2021-09-17 DIAGNOSIS — Z86010 Personal history of colonic polyps: Secondary | ICD-10-CM | POA: Diagnosis not present

## 2021-09-17 DIAGNOSIS — K6389 Other specified diseases of intestine: Secondary | ICD-10-CM | POA: Diagnosis not present

## 2021-09-19 DIAGNOSIS — D120 Benign neoplasm of cecum: Secondary | ICD-10-CM | POA: Diagnosis not present

## 2021-09-19 DIAGNOSIS — K6389 Other specified diseases of intestine: Secondary | ICD-10-CM | POA: Diagnosis not present

## 2021-09-19 DIAGNOSIS — D122 Benign neoplasm of ascending colon: Secondary | ICD-10-CM | POA: Diagnosis not present

## 2021-09-20 ENCOUNTER — Telehealth: Payer: Self-pay | Admitting: Family Medicine

## 2021-09-20 NOTE — Telephone Encounter (Signed)
Left message for patient to call back and schedule Medicare Annual Wellness Visit (AWV) in office.   If not able to come in office, please offer to do virtually or by telephone.  Left office number and my jabber [phone].  Last AWV:08/17/2019  Please schedule at anytime with Nurse Health Advisor.

## 2021-10-01 ENCOUNTER — Encounter: Payer: Self-pay | Admitting: Family Medicine

## 2021-11-16 ENCOUNTER — Ambulatory Visit (INDEPENDENT_AMBULATORY_CARE_PROVIDER_SITE_OTHER): Payer: PPO

## 2021-11-16 VITALS — Ht 65.0 in | Wt 126.0 lb

## 2021-11-16 DIAGNOSIS — Z0000 Encounter for general adult medical examination without abnormal findings: Secondary | ICD-10-CM

## 2021-11-16 NOTE — Progress Notes (Signed)
Subjective:   Robin Maldonado is a 71 y.o. female who presents for Medicare Annual (Subsequent) preventive examination.  I connected with Robin Maldonado today by telephone and verified that I am speaking with the correct person using two identifiers. Location patient: home Location provider: work Persons participating in the virtual visit: patient, Marine scientist.    I discussed the limitations, risks, security and privacy concerns of performing an evaluation and management service by telephone and the availability of in person appointments. I also discussed with the patient that there may be a patient responsible charge related to this service. The patient expressed understanding and verbally consented to this telephonic visit.    Interactive audio and video telecommunications were attempted between this provider and patient, however failed, due to patient having technical difficulties OR patient did not have access to video capability.  We continued and completed visit with audio only.  Some vital signs may be absent or patient reported.   Time Spent with patient on telephone encounter: 20 minutes   Review of Systems     Cardiac Risk Factors include: advanced age (>49men, >57 women)     Objective:    Today's Vitals   11/16/21 1506  Weight: 126 lb (57.2 kg)  Height: 5\' 5"  (1.651 m)   Body mass index is 20.97 kg/m.  Advanced Directives 11/16/2021 08/17/2019 08/08/2017  Does Patient Have a Medical Advance Directive? Yes Yes No  Type of Paramedic of [place];Living will [place];Living will -  Does patient want to make changes to medical advance directive? - No - Patient declined -  Copy of [place] in Chart? No - copy requested No - copy requested -    Current Medications (verified) Outpatient Encounter Medications as of 11/16/2021  Medication Sig   alendronate (FOSAMAX) 70 MG tablet Take 1 tablet (70 mg total) by mouth every  7 (seven) days. Take with a full glass of water on an empty stomach.   No facility-administered encounter medications on file as of 11/16/2021.    Allergies (verified) Hydrocodone-acetaminophen   History: Past Medical History:  Diagnosis Date   No known health problems    Past Surgical History:  Procedure Laterality Date   CRYOTHERAPY     Family History  Problem Relation Age of Onset   Cancer Mother 38       colon   Heart disease Father    Stroke Brother    Diabetes Neg Hx    Hypertension Neg Hx    Social History   Socioeconomic History   Marital status: Widowed    Spouse name: Not on file   Number of children: 1   Years of education: Not on file   Highest education level: Some college, no degree  Occupational History    Comment: retired Physicist, medical  Tobacco Use   Smoking status: Never   Smokeless tobacco: Never  Substance and Sexual Activity   Alcohol use: No    Alcohol/week: 0.0 standard drinks   Drug use: No   Sexual activity: Never  Other Topics Concern   Not on file  Social History Narrative   Lives alone   Caffeine- 16 oz tea, 8 oz soda but not daily   Social Determinants of Health   Financial Resource Strain: Medium Risk   Difficulty of Paying Living Expenses: Somewhat hard  Food Insecurity: Not on file  Transportation Needs: No Transportation Needs   Lack of Transportation (Medical): No   Lack  of Transportation (Non-Medical): No  Physical Activity: Sufficiently Active   Days of Exercise per Week: 7 days   Minutes of Exercise per Session: 60 min  Stress: No Stress Concern Present   Feeling of Stress : Not at all  Social Connections: Moderately Isolated   Frequency of Communication with Friends and Family: Once a week   Frequency of Social Gatherings with Friends and Family: More than three times a week   Attends Religious Services: More than 4 times per year   Active Member of [employer] or Organizations: No   Attends Archivist  Meetings: Never   Marital Status: Widowed    Tobacco Counseling Counseling given: Not Answered   Clinical Intake:  Pre-visit preparation completed: Yes  Pain : No/denies pain     BMI - recorded: 20.97 Nutritional Status: BMI of 19-24  Normal Nutritional Risks: None Diabetes: No  How often do you need to have someone help you when you read instructions, pamphlets, or other written materials from your doctor or pharmacy?: 1 - Never  Diabetic?No  Interpreter Needed?: No  Information entered by :: Caroleen Hamman LPN   Activities of Daily Living In your present state of health, do you have any difficulty performing the following activities: 11/16/2021 11/24/2020  Hearing? N N  Vision? N N  Difficulty concentrating or making decisions? N N  Walking or climbing stairs? N N  Dressing or bathing? N N  Doing errands, shopping? N N  Preparing Food and eating ? N -  Using the Toilet? N -  In the past six months, have you accidently leaked urine? N -  Do you have problems with loss of bowel control? N -  Managing your Medications? N -  Managing your Finances? N -  Housekeeping or managing your Housekeeping? N -  Some recent data might be hidden    Patient Care Team: [name], DO as PCP - General (Family Medicine)  Indicate any recent Medical Services you may have received from other than [facility] providers in the past year (date may be approximate).     Assessment:   This is a routine wellness examination for [name].  Hearing/Vision screen Hearing Screening - Comments:: No issues Vision Screening - Comments:: Last eye exam-2 years ago-Dr. Chapman Fitch  Dietary issues and exercise activities discussed: Current Exercise Habits: Home exercise routine, Type of exercise: walking (exercise bike), Time (Minutes): 60, Frequency (Times/Week): 7, Weekly Exercise (Minutes/Week): 420, Intensity: Mild, Exercise limited by: None identified   Goals Addressed             This  Visit's Progress    Patient Stated   On track    Maintain healthy active lifestyle.       Depression Screen PHQ 2/9 Scores 11/16/2021 06/05/2021 11/24/2020 08/17/2019  PHQ - 2 Score 0 0 0 0    Fall Risk Fall Risk  11/16/2021 06/05/2021 11/24/2020 08/17/2019 06/04/2019  Falls in the past year? 0 0 0 0 1  Comment - - - - Emmi Telephone Survey: data to providers prior to load  Number falls in past yr: 0 0 0 0 1  Comment - - - - Emmi Telephone Survey Actual Response = 1  Injury with Fall? 0 0 0 0 0  Risk for fall due to : - No Fall Risks - - -  Follow up Falls prevention discussed Falls evaluation completed - - -    FALL RISK PREVENTION PERTAINING TO THE HOME:  Any stairs in or  around the home? Yes  If so, are there any without handrails? No  Home free of loose throw rugs in walkways, pet beds, electrical cords, etc? Yes  Adequate lighting in your home to reduce risk of falls? Yes   ASSISTIVE DEVICES UTILIZED TO PREVENT FALLS:  Life alert? No  Use of a cane, walker or w/c? No  Grab bars in the bathroom? No  Shower chair or bench in shower? No  Elevated toilet seat or a handicapped toilet? No   TIMED UP AND GO:  Was the test performed? No . Phone visit   Cognitive Function: Normal cognitive status assessed by this Nurse Health Advisor. No abnormalities found.          Immunizations Immunization History  Administered Date(s) Administered   Tdap 07/03/2021    TDAP status: Up to date  Flu Vaccine status: Declined, Education has been provided regarding the importance of this vaccine but patient still declined. Advised may receive this vaccine at local pharmacy or Health Dept. Aware to provide a copy of the vaccination record if obtained from local pharmacy or Health Dept. Verbalized acceptance and understanding.  Pneumococcal vaccine status: Declined,  Education has been provided regarding the importance of this vaccine but patient still declined. Advised may receive this  vaccine at local pharmacy or Health Dept. Aware to provide a copy of the vaccination record if obtained from local pharmacy or Health Dept. Verbalized acceptance and understanding.   Covid-19 vaccine status: Declined, Education has been provided regarding the importance of this vaccine but patient still declined. Advised may receive this vaccine at local pharmacy or Health Dept.or vaccine clinic. Aware to provide a copy of the vaccination record if obtained from local pharmacy or Health Dept. Verbalized acceptance and understanding.  Qualifies for Shingles Vaccine? Yes   Zostavax completed No   Shingrix Completed?: No.    Education has been provided regarding the importance of this vaccine. Patient has been advised to call insurance company to determine out of pocket expense if they have not yet received this vaccine. Advised may also receive vaccine at local pharmacy or Health Dept. Verbalized acceptance and understanding.  Screening Tests Health Maintenance  Topic Date Due   Pneumonia Vaccine 67+ Years old (1 - PCV) Never done   INFLUENZA VACCINE  Never done   COVID-19 Vaccine (1) 11/24/2021 (Originally 07/22/1951)   MAMMOGRAM  08/01/2023   COLONOSCOPY (Pts 45-26yrs Insurance coverage will need to be confirmed)  05/04/2028   TETANUS/TDAP  07/04/2031   DEXA SCAN  Completed   Hepatitis C Screening  Completed   HPV VACCINES  Aged Out   Zoster Vaccines- Shingrix  Discontinued    Health Maintenance  Health Maintenance Due  Topic Date Due   Pneumonia Vaccine 46+ Years old (1 - PCV) Never done   INFLUENZA VACCINE  Never done    Colorectal cancer screening: Type of screening: Colonoscopy. Completed 2022 per patient. Repeat every 3 years per patient.  Mammogram status: Completed bilateral 07/31/2021. Repeat every year  Bone Density status: Completed 08/02/2021. Results reflect: Bone density results: OSTEOPOROSIS. Repeat every 2 years.  Lung Cancer Screening: (Low Dose CT Chest recommended  if Age 66-80 years, 30 pack-year currently smoking OR have quit w/in 15years.) does not qualify.     Additional Screening:  Hepatitis C Screening: Completed 02/04/2018  Vision Screening: Recommended annual ophthalmology exams for early detection of glaucoma and other disorders of the eye. Is the patient up to date with their annual eye exam?  No  Who is the provider or what is the name of the office in which the patient attends annual eye exams? Dr. Sherian Rein plans to make an appt    Dental Screening: Recommended annual dental exams for proper oral hygiene  Community Resource Referral / Chronic Care Management: CRR required this visit?  Yes For food assistance  CCM required this visit?  No      Plan:     I have personally reviewed and noted the following in the patients chart:   Medical and social history Use of alcohol, tobacco or illicit drugs  Current medications and supplements including opioid prescriptions.  Functional ability and status Nutritional status Physical activity Advanced directives List of other physicians Hospitalizations, surgeries, and ER visits in previous 12 months Vitals Screenings to include cognitive, depression, and falls Referrals and appointments  In addition, I have reviewed and discussed with patient certain preventive protocols, quality metrics, and best practice recommendations. A written personalized care plan for preventive services as well as general preventive health recommendations were provided to patient.   Due to this being a telephonic visit, the after visit summary with patients personalized plan was offered to patient via mail or my-chart. Patient would like to access on my-chart.   Marta Antu, LPN   7/98/9211  Nurse Health Advisor  Nurse Notes: None

## 2021-11-16 NOTE — Patient Instructions (Signed)
Ms. Robin Maldonado , Thank you for taking time to complete your Medicare Wellness Visit. I appreciate your ongoing commitment to your health goals. Please review the following plan we discussed and let me know if I can assist you in the future.   Screening recommendations/referrals: Colonoscopy: Completed 2022-Due 2025 Mammogram: Completed 07/31/2021-Due 07/31/2022 Bone Density: Completed 08/02/2021-Due 08/03/2023 Recommended yearly ophthalmology/optometry visit for glaucoma screening and checkup Recommended yearly dental visit for hygiene and checkup  Vaccinations: Influenza vaccine: Declined Pneumococcal vaccine: Declined Tdap vaccine: Up to date Shingles vaccine:-May obtain vaccine at our your local pharmacy. Covid-19:Declined  Advanced directives: Please bring a copy of Living Will and/or Healthcare Power of Attorney for your chart.   Conditions/risks identified: See problem list  Next appointment: Follow up in one year for your annual wellness visit    Preventive Care 65 Years and Older, Female Preventive care refers to lifestyle choices and visits with your health care provider that can promote health and wellness. What does preventive care include? A yearly physical exam. This is also called an annual well check. Dental exams once or twice a year. Routine eye exams. Ask your health care provider how often you should have your eyes checked. Personal lifestyle choices, including: Daily care of your teeth and gums. Regular physical activity. Eating a healthy diet. Avoiding tobacco and drug use. Limiting alcohol use. Practicing safe sex. Taking low-dose aspirin every day. Taking vitamin and mineral supplements as recommended by your health care provider. What happens during an annual well check? The services and screenings done by your health care provider during your annual well check will depend on your age, overall health, lifestyle risk factors, and family history of  disease. Counseling  Your health care provider may ask you questions about your: Alcohol use. Tobacco use. Drug use. Emotional well-being. Home and relationship well-being. Sexual activity. Eating habits. History of falls. Memory and ability to understand (cognition). Work and work Statistician. Reproductive health. Screening  You may have the following tests or measurements: Height, weight, and BMI. Blood pressure. Lipid and cholesterol levels. These may be checked every 5 years, or more frequently if you are over 76 years old. Skin check. Lung cancer screening. You may have this screening every year starting at age 50 if you have a 30-pack-year history of smoking and currently smoke or have quit within the past 15 years. Fecal occult blood test (FOBT) of the stool. You may have this test every year starting at age 4. Flexible sigmoidoscopy or colonoscopy. You may have a sigmoidoscopy every 5 years or a colonoscopy every 10 years starting at age 21. Hepatitis C blood test. Hepatitis B blood test. Sexually transmitted disease (STD) testing. Diabetes screening. This is done by checking your blood sugar (glucose) after you have not eaten for a while (fasting). You may have this done every 1-3 years. Bone density scan. This is done to screen for osteoporosis. You may have this done starting at age 16. Mammogram. This may be done every 1-2 years. Talk to your health care provider about how often you should have regular mammograms. Talk with your health care provider about your test results, treatment options, and if necessary, the need for more tests. Vaccines  Your health care provider may recommend certain vaccines, such as: Influenza vaccine. This is recommended every year. Tetanus, diphtheria, and acellular pertussis (Tdap, Td) vaccine. You may need a Td booster every 10 years. Zoster vaccine. You may need this after age 78. Pneumococcal 13-valent conjugate (PCV13) vaccine. One  dose is recommended after age 61. Pneumococcal polysaccharide (PPSV23) vaccine. One dose is recommended after age 68. Talk to your health care provider about which screenings and vaccines you need and how often you need them. This information is not intended to replace advice given to you by your health care provider. Make sure you discuss any questions you have with your health care provider. Document Released: 11/17/2015 Document Revised: 07/10/2016 Document Reviewed: 08/22/2015 Elsevier Interactive Patient Education  2017 [place] Prevention in the Home Falls can cause injuries. They can happen to people of all ages. There are many things you can do to make your home safe and to help prevent falls. What can I do on the outside of my home? Regularly fix the edges of walkways and driveways and fix any cracks. Remove anything that might make you trip as you walk through a door, such as a raised step or threshold. Trim any bushes or trees on the path to your home. Use bright outdoor lighting. Clear any walking paths of anything that might make someone trip, such as rocks or tools. Regularly check to see if handrails are loose or broken. Make sure that both sides of any steps have handrails. Any raised decks and porches should have guardrails on the edges. Have any leaves, snow, or ice cleared regularly. Use sand or salt on walking paths during winter. Clean up any spills in your garage right away. This includes oil or grease spills. What can I do in the bathroom? Use night lights. Install grab bars by the toilet and in the tub and shower. Do not use towel bars as grab bars. Use non-skid mats or decals in the tub or shower. If you need to sit down in the shower, use a plastic, non-slip stool. Keep the floor dry. Clean up any water that spills on the floor as soon as it happens. Remove soap buildup in the tub or shower regularly. Attach bath mats securely with double-sided  non-slip rug tape. Do not have throw rugs and other things on the floor that can make you trip. What can I do in the bedroom? Use night lights. Make sure that you have a light by your bed that is easy to reach. Do not use any sheets or blankets that are too big for your bed. They should not hang down onto the floor. Have a firm chair that has side arms. You can use this for support while you get dressed. Do not have throw rugs and other things on the floor that can make you trip. What can I do in the kitchen? Clean up any spills right away. Avoid walking on wet floors. Keep items that you use a lot in easy-to-reach places. If you need to reach something above you, use a strong step stool that has a grab bar. Keep electrical cords out of the way. Do not use floor polish or wax that makes floors slippery. If you must use wax, use non-skid floor wax. Do not have throw rugs and other things on the floor that can make you trip. What can I do with my stairs? Do not leave any items on the stairs. Make sure that there are handrails on both sides of the stairs and use them. Fix handrails that are broken or loose. Make sure that handrails are as long as the stairways. Check any carpeting to make sure that it is firmly attached to the stairs. Fix any carpet that is loose or worn. Avoid  having throw rugs at the top or bottom of the stairs. If you do have throw rugs, attach them to the floor with carpet tape. Make sure that you have a light switch at the top of the stairs and the bottom of the stairs. If you do not have them, ask someone to add them for you. What else can I do to help prevent falls? Wear shoes that: Do not have high heels. Have rubber bottoms. Are comfortable and fit you well. Are closed at the toe. Do not wear sandals. If you use a stepladder: Make sure that it is fully opened. Do not climb a closed stepladder. Make sure that both sides of the stepladder are locked into place. Ask  someone to hold it for you, if possible. Clearly mark and make sure that you can see: Any grab bars or handrails. First and last steps. Where the edge of each step is. Use tools that help you move around (mobility aids) if they are needed. These include: Canes. Walkers. Scooters. Crutches. Turn on the lights when you go into a dark area. Replace any light bulbs as soon as they burn out. Set up your furniture so you have a clear path. Avoid moving your furniture around. If any of your floors are uneven, fix them. If there are any pets around you, be aware of where they are. Review your medicines with your doctor. Some medicines can make you feel dizzy. This can increase your chance of falling. Ask your doctor what other things that you can do to help prevent falls. This information is not intended to replace advice given to you by your health care provider. Make sure you discuss any questions you have with your health care provider. Document Released: 08/17/2009 Document Revised: 03/28/2016 Document Reviewed: 11/25/2014 Elsevier Interactive Patient Education  2017 [employer].

## 2021-12-03 ENCOUNTER — Telehealth: Payer: Self-pay | Admitting: *Deleted

## 2021-12-03 NOTE — Telephone Encounter (Signed)
° °  Telephone encounter was:  Unsuccessful.  12/03/2021 Name: Robin Maldonado MRN: [id number] DOB: 23-Oct-1951  Unsuccessful outbound call made today to assist with:  Food Insecurity  Outreach Attempt:  1st Attempt  A HIPAA compliant voice message was left requesting a return call.  Instructed patient to call back at   Instructed patient to call back at [phone]  at their earliest convenience. .  [place], Care Management  [phone] [address] , [place] [address] Email : [email or web address]

## 2021-12-11 ENCOUNTER — Ambulatory Visit (INDEPENDENT_AMBULATORY_CARE_PROVIDER_SITE_OTHER): Payer: PPO | Admitting: Family Medicine

## 2021-12-11 ENCOUNTER — Other Ambulatory Visit (HOSPITAL_BASED_OUTPATIENT_CLINIC_OR_DEPARTMENT_OTHER): Payer: Self-pay

## 2021-12-11 ENCOUNTER — Encounter: Payer: Self-pay | Admitting: Family Medicine

## 2021-12-11 VITALS — BP 104/60 | HR 83 | Temp 98.0°F | Ht 64.0 in | Wt 127.2 lb

## 2021-12-11 DIAGNOSIS — J209 Acute bronchitis, unspecified: Secondary | ICD-10-CM | POA: Diagnosis not present

## 2021-12-11 DIAGNOSIS — J0140 Acute pansinusitis, unspecified: Secondary | ICD-10-CM

## 2021-12-11 MED ORDER — BENZONATATE 200 MG PO CAPS
200.0000 mg | ORAL_CAPSULE | Freq: Two times a day (BID) | ORAL | 0 refills | Status: DC | PRN
  Filled 2021-12-11: qty 20, 10d supply, fill #0

## 2021-12-11 MED ORDER — PROMETHAZINE-DM 6.25-15 MG/5ML PO SYRP
5.0000 mL | ORAL_SOLUTION | Freq: Every evening | ORAL | 0 refills | Status: DC | PRN
  Filled 2021-12-11: qty 118, 23d supply, fill #0

## 2021-12-11 MED ORDER — PREDNISONE 20 MG PO TABS
40.0000 mg | ORAL_TABLET | Freq: Every day | ORAL | 0 refills | Status: AC
  Filled 2021-12-11: qty 10, 5d supply, fill #0

## 2021-12-11 MED ORDER — DOXYCYCLINE HYCLATE 100 MG PO TABS
100.0000 mg | ORAL_TABLET | Freq: Two times a day (BID) | ORAL | 0 refills | Status: AC
  Filled 2021-12-11: qty 14, 7d supply, fill #0

## 2021-12-11 NOTE — Patient Instructions (Signed)
Continue to push fluids, practice good hand hygiene, and cover your mouth if you cough. ? ?If you start having fevers, shaking or shortness of breath, seek immediate care. ? ?OK to take Tylenol 1000 mg (2 extra strength tabs) or 975 mg (3 regular strength tabs) every 6 hours as needed. ? ?Let us know if you need anything. ?

## 2021-12-11 NOTE — Progress Notes (Addendum)
Chief Complaint  Patient presents with   Cough    Robin Maldonado here for URI complaints.  Duration: 1 week  Associated symptoms: sinus headache, sinus congestion, sinus pain, rhinorrhea, and coughing Denies: itchy watery eyes, ear pain, ear drainage, sore throat, wheezing, shortness of breath, myalgia, and fevers Treatment to date: Nyquil, Dayquil, Mucinex, Robitussin, cough drops Sick contacts: Yes; grandson Did not test for covid.   Past Medical History:  Diagnosis Date   No known health problems     Objective BP 104/60    Pulse 83    Temp 98 F (36.7 C) (Oral)    Ht 5\' 4"  (1.626 m)    Wt 127 lb 4 oz (57.7 kg)    SpO2 98%    BMI 21.84 kg/m  General: Awake, alert, appears stated age HEENT: AT, [place], ears patent b/l and TM's neg, nares patent w/o discharge, pharynx pink and without exudates, MMM, max and frontal sinuses ttp b/l.  Neck: No masses or asymmetry Heart: RRR Lungs: CTAB, no accessory muscle use Psych: Age appropriate judgment and insight, normal mood and affect  Acute pansinusitis, recurrence not specified - Plan: predniSONE (DELTASONE) 20 MG tablet, doxycycline (VIBRA-TABS) 100 MG tablet  Acute bronchitis, unspecified organism - Plan: promethazine-dextromethorphan (PROMETHAZINE-DM) 6.25-15 MG/5ML syrup, benzonatate (TESSALON) 200 MG capsule  5 d pred burst 40 mg/d for sinus pressure, Tessalon Perles prn during day for cough, syrup at night, warnings about drowsiness verbalized and written down. Doxy in 3 d if no better.  Continue to push fluids, practice good hand hygiene, cover mouth when coughing. F/u prn. If starting to experience fevers, shaking, or shortness of breath, seek immediate care. Pt voiced understanding and agreement to the plan.  [place], DO 12/11/21 3:27 PM

## 2021-12-12 ENCOUNTER — Telehealth: Payer: Self-pay | Admitting: *Deleted

## 2021-12-12 NOTE — Telephone Encounter (Signed)
° °  Telephone encounter was:  Unsuccessful.  12/12/2021 Name: Demoni Parmar MRN: [id number] DOB: 1951/02/03  Unsuccessful outbound call made today to assist with:  Food Insecurity  Outreach Attempt:  2nd Attempt  A HIPAA compliant voice message was left requesting a return call.  Instructed patient to call back at   Instructed patient to call back at [phone]  at their earliest convenience. .  [place], Care Management  [phone] [address] , [place] [address] Email : [email or web address]

## 2021-12-13 ENCOUNTER — Telehealth: Payer: Self-pay | Admitting: *Deleted

## 2021-12-13 NOTE — Telephone Encounter (Signed)
° °  Telephone encounter was:  Unsuccessful.  12/13/2021 Name: Robin Maldonado MRN: [id number] DOB: November 21, 1950  Unsuccessful outbound call made today to assist with:  Food Insecurity  Outreach Attempt:  2nd Attempt  A HIPAA compliant voice message was left requesting a return call.  Instructed patient to call back at   Instructed patient to call back at [phone]  at their earliest convenience. .  [place], Care Management  [phone] [address] , [place] [address] Email : [email or web address]

## 2021-12-20 ENCOUNTER — Telehealth: Payer: Self-pay | Admitting: *Deleted

## 2021-12-20 NOTE — Telephone Encounter (Signed)
° °  Telephone encounter was:  Unsuccessful.  12/20/2021 Name: Robin Maldonado MRN: [id number] DOB: [birth year]  Unsuccessful outbound call made today to assist with:  Food Insecurity and Utilities  Outreach Attempt:  2nd Attempt  Follow up call   A HIPAA compliant voice message was left requesting a return call.  Instructed patient to call back at   Instructed patient to call back at [phone]  at their earliest convenience. .  [facility], [facility]  [phone] [address] , [place] [address] Email : [email or web address]

## 2021-12-21 ENCOUNTER — Telehealth: Payer: Self-pay | Admitting: *Deleted

## 2021-12-21 NOTE — Telephone Encounter (Signed)
° °  Telephone encounter was:  Unsuccessful.  12/21/2021 Name: Robin Maldonado MRN: [id number] DOB: 1951/01/14  Unsuccessful outbound call made today to assist with:  Food Insecurity  Outreach Attempt:  3rd Attempt.  Referral closed unable to contact patient.  A HIPAA compliant voice message was left requesting a return call.  Instructed patient to call back at   Instructed patient to call back at [phone]  at their earliest convenience. .  [facility], [facility]  [phone] [address] , [place] [address] Email : [email or web address] [email or web address]

## 2021-12-24 ENCOUNTER — Telehealth: Payer: Self-pay | Admitting: *Deleted

## 2021-12-24 NOTE — Telephone Encounter (Signed)
° °  Telephone encounter was:  Unsuccessful.  12/24/2021 Name: Robin Maldonado MRN: [id number] DOB: 09-03-1951  Unsuccessful outbound call made today to assist with:  Food Insecurity  Outreach Attempt:  3rd Attempt.  Referral closed unable to contact patient.  A HIPAA compliant voice message was left requesting a return call.  Instructed patient to call back at   Instructed patient to call back at [phone]  at their earliest convenience. .  [facility], [facility] Management  [phone] [address] , [place] [address] Email : [email or web address] [email or web address]

## 2022-01-10 ENCOUNTER — Other Ambulatory Visit: Payer: Self-pay

## 2022-01-10 ENCOUNTER — Encounter: Payer: Self-pay | Admitting: Family Medicine

## 2022-01-10 ENCOUNTER — Ambulatory Visit (HOSPITAL_BASED_OUTPATIENT_CLINIC_OR_DEPARTMENT_OTHER)
Admission: RE | Admit: 2022-01-10 | Discharge: 2022-01-10 | Disposition: A | Discharge status: Home / Self Care | Payer: PPO | Source: Ambulatory Visit | Attending: Family Medicine | Admitting: Family Medicine

## 2022-01-10 ENCOUNTER — Other Ambulatory Visit: Payer: Self-pay | Admitting: Family Medicine

## 2022-01-10 ENCOUNTER — Ambulatory Visit (INDEPENDENT_AMBULATORY_CARE_PROVIDER_SITE_OTHER): Payer: PPO | Admitting: Family Medicine

## 2022-01-10 VITALS — BP 110/68 | HR 81 | Temp 97.9°F | Resp 16 | Ht 64.0 in | Wt 124.2 lb

## 2022-01-10 DIAGNOSIS — J40 Bronchitis, not specified as acute or chronic: Secondary | ICD-10-CM

## 2022-01-10 DIAGNOSIS — S22000A Wedge compression fracture of unspecified thoracic vertebra, initial encounter for closed fracture: Secondary | ICD-10-CM

## 2022-01-10 DIAGNOSIS — R059 Cough, unspecified: Secondary | ICD-10-CM | POA: Diagnosis not present

## 2022-01-10 DIAGNOSIS — R051 Acute cough: Secondary | ICD-10-CM

## 2022-01-10 MED ORDER — AZITHROMYCIN 250 MG PO TABS: ORAL_TABLET | ORAL | 0 refills | Status: DC

## 2022-01-10 NOTE — Patient Instructions (Signed)
Acute Bronchitis, Adult °Acute bronchitis is sudden inflammation of the main airways (bronchi) that come off the windpipe (trachea) in the lungs. The swelling causes the airways to get smaller and make more mucus than normal. This can make it hard to breathe and can cause coughing or noisy breathing (wheezing). °Acute bronchitis may last several weeks. The cough may last longer. Allergies, asthma, and exposure to smoke may make the condition worse. °What are the causes? °This condition can be caused by germs and by substances that irritate the lungs, including: °Cold and flu viruses. The most common cause of this condition is the virus that causes the common cold. °Bacteria. This is less common. °Breathing in substances that irritate the lungs, including: °Smoke from cigarettes and other forms of tobacco. °Dust and pollen. °Fumes from household cleaning products, gases, or burned fuel. °Indoor or outdoor air pollution. °What increases the risk? °The following factors may make you more likely to develop this condition: °A weak body's defense system, also called the immune system. °A condition that affects your lungs and breathing, such as asthma. °What are the signs or symptoms? °Common symptoms of this condition include: °Coughing. This may bring up clear, yellow, or green mucus from your lungs (sputum). °Wheezing. °Runny or stuffy nose. °Having too much mucus in your lungs (chest congestion). °Shortness of breath. °Aches and pains, including sore throat or chest. °How is this diagnosed? °This condition is usually diagnosed based on: °Your symptoms and medical history. °A physical exam. °You may also have other tests, including tests to rule out other conditions, such as pneumonia. These tests include: °A test of lung function. °Test of a mucus sample to look for the presence of bacteria. °Tests to check the oxygen level in your blood. °Blood tests. °Chest X-ray. °How is this treated? °Most cases of acute bronchitis  clear up over time without treatment. Your health care provider may recommend: °Drinking more fluids to help thin your mucus so it is easier to cough up. °Taking inhaled medicine (inhaler) to improve air flow in and out of your lungs. °Using a vaporizer or a humidifier. These are machines that add water to the air to help you breathe better. °Taking a medicine that thins mucus and clears congestion (expectorant). °Taking a medicine that prevents or stops coughing (cough suppressant). °It is notcommon to take an antibiotic medicine for this condition. °Follow these instructions at home: ° °Take over-the-counter and prescription medicines only as told by your health care provider. °Use an inhaler, vaporizer, or humidifier as told by your health care provider. °Take two teaspoons (10 mL) of honey at bedtime to lessen coughing at night. °Drink enough fluid to keep your urine pale yellow. °Do not use any products that contain nicotine or tobacco. These products include cigarettes, chewing tobacco, and vaping devices, such as e-cigarettes. If you need help quitting, ask your health care provider. °Get plenty of rest. °Return to your normal activities as told by your health care provider. Ask your health care provider what activities are safe for you. °Keep all follow-up visits. This is important. °How is this prevented? °To lower your risk of getting this condition again: °Wash your hands often with soap and water for at least 20 seconds. If soap and water are not available, use hand sanitizer. °Avoid contact with people who have cold symptoms. °Try not to touch your mouth, nose, or eyes with your hands. °Avoid breathing in smoke or chemical fumes. Breathing smoke or chemical fumes will make your condition   worse. °Get the flu shot every year. °Contact a health care provider if: °Your symptoms do not improve after 2 weeks. °You have trouble coughing up the mucus. °Your cough keeps you awake at night. °You have a  fever. °Get help right away if you: °Cough up blood. °Feel pain in your chest. °Have severe shortness of breath. °Faint or keep feeling like you are going to faint. °Have a severe headache. °Have a fever or chills that get worse. °These symptoms may represent a serious problem that is an emergency. Do not wait to see if the symptoms will go away. Get medical help right away. Call your local emergency services (911 in the [place] Do not drive yourself to the hospital. °Summary °Acute bronchitis is inflammation of the main airways (bronchi) that come off the windpipe (trachea) in the lungs. The swelling causes the airways to get smaller and make more mucus than normal. °Drinking more fluids can help thin your mucus so it is easier to cough up. °Take over-the-counter and prescription medicines only as told by your health care provider. °Do not use any products that contain nicotine or tobacco. These products include cigarettes, chewing tobacco, and vaping devices, such as e-cigarettes. If you need help quitting, ask your health care provider. °Contact a health care provider if your symptoms do not improve after 2 weeks. °This information is not intended to replace advice given to you by your health care provider. Make sure you discuss any questions you have with your health care provider. °Document Revised: 02/21/2021 Document Reviewed: 02/21/2021 °Elsevier Patient Education © 2022 Elsevier Inc. ° °

## 2022-01-10 NOTE — Progress Notes (Addendum)
Subjective:   By signing my name below, I, Shehryar Baig, attest that this documentation has been prepared under the direction and in the presence of Ann Held, DO. 01/10/2022     Patient ID: Robin Maldonado, female    DOB: October 07, 1951, 71 y.o.   MRN: [id number]  Chief Complaint  Patient presents with   Cough    X1 month, nonproductive cough. Pt requesting imaging.    Follow-up    Cough Pertinent negatives include no chest pain, fever, headaches, rash, shortness of breath or wheezing.  Patient is in today for a office visit.   She continues complaining of a persistent dry cough and mild congestion for the past 4 weeks. She has mild yellow sputum production with cough. Her cough is worse at night and it keeps her from sleeping. She denies having any wheezing. She reports 4 weeks ago when her cough started she was diagnosed with bronchitis. She also reports while being treated for bronchitis her cough symptoms improved but shortly returned. She was given 5 day course of doxycycline, prednisone, and promethazine-DM. Promethazine-DM did not bring her relief. She also reports not feeling well while taking prednisone and prefers not taking it again. She is taking mucinex and Nyquil to manage her symptoms at this time. She has tried [employer] in the past and found she felt sick while taking it.    Past Medical History:  Diagnosis Date   No known health problems     Past Surgical History:  Procedure Laterality Date   CRYOTHERAPY      Family History  Problem Relation Age of Onset   Cancer Mother 31       colon   Heart disease Father    Stroke Brother    Diabetes Neg Hx    Hypertension Neg Hx     Social History   Socioeconomic History   Marital status: Widowed    Spouse name: Not on file   Number of children: 1   Years of education: Not on file   Highest education level: Some college, no degree  Occupational History    Comment: retired Physicist, medical   Tobacco Use   Smoking status: Never   Smokeless tobacco: Never  Substance and Sexual Activity   Alcohol use: No    Alcohol/week: 0.0 standard drinks   Drug use: No   Sexual activity: Never  Other Topics Concern   Not on file  Social History Narrative   Lives alone   Caffeine- 16 oz tea, 8 oz soda but not daily   Social Determinants of Health   Financial Resource Strain: Medium Risk   Difficulty of Paying Living Expenses: Somewhat hard  Food Insecurity: Food Insecurity Present   Worried About Charity fundraiser in the Last Year: Sometimes true   Ran Out of Food in the Last Year: Sometimes true  Transportation Needs: No Transportation Needs   Lack of Transportation (Medical): No   Lack of Transportation (Non-Medical): No  Physical Activity: Sufficiently Active   Days of Exercise per Week: 7 days   Minutes of Exercise per Session: 60 min  Stress: No Stress Concern Present   Feeling of Stress : Not at all  Social Connections: Moderately Isolated   Frequency of Communication with Friends and Family: Once a week   Frequency of Social Gatherings with Friends and Family: Once a week   Attends Religious Services: 1 to 4 times per year   Active Member of [employer]  or Organizations: No   Attends Archivist Meetings: 1 to 4 times per year   Marital Status: Divorced  Human resources officer Violence: Not At Risk   Fear of Current or Ex-Partner: No   Emotionally Abused: No   Physically Abused: No   Sexually Abused: No    Outpatient Medications Prior to Visit  Medication Sig Dispense Refill   alendronate (FOSAMAX) 70 MG tablet Take 1 tablet (70 mg total) by mouth every 7 (seven) days. Take with a full glass of water on an empty stomach. 4 tablet 11   benzonatate (TESSALON) 200 MG capsule Take 1 capsule (200 mg total) by mouth 2 (two) times daily as needed for cough. (Patient not taking: Reported on 01/10/2022) 20 capsule 0   promethazine-dextromethorphan (PROMETHAZINE-DM) 6.25-15  MG/5ML syrup Take 5 mLs by mouth at bedtime as needed for cough. (Patient not taking: Reported on 01/10/2022) 118 mL 0   No facility-administered medications prior to visit.    Allergies  Allergen Reactions   Hydrocodone-Acetaminophen Nausea And Vomiting   Tessalon [Benzonatate] Nausea And Vomiting    Review of Systems  Constitutional:  Negative for fever and malaise/fatigue.  HENT:  Positive for congestion.   Eyes:  Negative for blurred vision.  Respiratory:  Positive for cough and sputum production (yellow). Negative for shortness of breath and wheezing.   Cardiovascular:  Negative for chest pain, palpitations and leg swelling.  Gastrointestinal:  Negative for vomiting.  Musculoskeletal:  Negative for back pain.  Skin:  Negative for rash.  Neurological:  Negative for loss of consciousness and headaches.      Objective:    Physical Exam Vitals and nursing note reviewed.  Constitutional:      General: She is not in acute distress.    Appearance: Normal appearance. She is not ill-appearing.  HENT:     Head: Normocephalic and atraumatic.     Right Ear: External ear normal.     Left Ear: External ear normal.  Eyes:     Extraocular Movements: Extraocular movements intact.     Pupils: Pupils are equal, round, and reactive to light.  Cardiovascular:     Rate and Rhythm: Normal rate and regular rhythm.     Heart sounds: Normal heart sounds. No murmur heard.   No gallop.  Pulmonary:     Effort: Pulmonary effort is normal. No respiratory distress.     Breath sounds: Decreased breath sounds present. No wheezing or rales.  Skin:    General: Skin is warm and dry.  Neurological:     Mental Status: She is alert and oriented to person, place, and time.  Psychiatric:        Judgment: Judgment normal.    BP 110/68 (BP Location: Right Arm, Patient Position: Sitting, Cuff Size: Normal)    Pulse 81    Temp 97.9 F (36.6 C) (Oral)    Resp 16    Ht '5\' 4"'$  (1.626 m)    Wt 124 lb 3.2 oz  (56.3 kg)    SpO2 98%    BMI 21.32 kg/m  Wt Readings from Last 3 Encounters:  01/10/22 124 lb 3.2 oz (56.3 kg)  12/11/21 127 lb 4 oz (57.7 kg)  11/16/21 126 lb (57.2 kg)    Diabetic Foot Exam - Simple   No data filed    Lab Results  Component Value Date   WBC 6.0 06/05/2021   HGB 13.4 06/05/2021   HCT 40.3 06/05/2021   PLT 274.0 06/05/2021   GLUCOSE  109 (H) 06/05/2021   CHOL 245 (H) 07/03/2021   TRIG 116.0 07/03/2021   HDL 77.80 07/03/2021   LDLCALC 144 (H) 07/03/2021   ALT 9 06/05/2021   AST 15 06/05/2021   NA 140 06/05/2021   K 4.6 06/05/2021   CL 102 06/05/2021   CREATININE 0.83 06/05/2021   BUN 15 06/05/2021   CO2 28 06/05/2021   TSH 1.289 08/08/2017   HGBA1C 6.2 06/05/2021    Lab Results  Component Value Date   TSH 1.289 08/08/2017   Lab Results  Component Value Date   WBC 6.0 06/05/2021   HGB 13.4 06/05/2021   HCT 40.3 06/05/2021   MCV 92.7 06/05/2021   PLT 274.0 06/05/2021   Lab Results  Component Value Date   NA 140 06/05/2021   K 4.6 06/05/2021   CO2 28 06/05/2021   GLUCOSE 109 (H) 06/05/2021   BUN 15 06/05/2021   CREATININE 0.83 06/05/2021   BILITOT 0.4 06/05/2021   ALKPHOS 104 06/05/2021   AST 15 06/05/2021   ALT 9 06/05/2021   PROT 6.8 06/05/2021   ALBUMIN 4.3 06/05/2021   CALCIUM 9.2 06/05/2021   ANIONGAP 8 08/08/2017   GFR 71.95 06/05/2021   Lab Results  Component Value Date   CHOL 245 (H) 07/03/2021   Lab Results  Component Value Date   HDL 77.80 07/03/2021   Lab Results  Component Value Date   LDLCALC 144 (H) 07/03/2021   Lab Results  Component Value Date   TRIG 116.0 07/03/2021   Lab Results  Component Value Date   CHOLHDL 3 07/03/2021   Lab Results  Component Value Date   HGBA1C 6.2 06/05/2021       Assessment & Plan:   Problem List Items Addressed This Visit   None Visit Diagnoses     Bronchitis    -  Primary   Relevant Medications   azithromycin (ZITHROMAX Z-PAK) 250 MG tablet   Acute cough        Relevant Orders   DG Chest 2 View (Completed)        Meds ordered this encounter  Medications   azithromycin (ZITHROMAX Z-PAK) 250 MG tablet    Sig: As directed    Dispense:  6 each    Refill:  0    I, Ann Held, DO, personally preformed the services described in this documentation.  All medical record entries made by the scribe were at my direction and in my presence.  I have reviewed the chart and discharge instructions (if applicable) and agree that the record reflects my personal performance and is accurate and complete. 01/10/2022   I,Shehryar Baig,acting as a scribe for Ann Held, DO.,have documented all relevant documentation on the behalf of Ann Held, DO,as directed by  Ann Held, DO while in the presence of Ann Held, DO.   Ann Held, DO

## 2022-01-10 NOTE — Assessment & Plan Note (Signed)
abx per orders  ?cxr today  ?Call or rto prn ?

## 2022-01-12 ENCOUNTER — Ambulatory Visit (HOSPITAL_BASED_OUTPATIENT_CLINIC_OR_DEPARTMENT_OTHER)
Admission: RE | Admit: 2022-01-12 | Discharge: 2022-01-12 | Disposition: A | Discharge status: Home / Self Care | Payer: PPO | Source: Ambulatory Visit | Attending: Family Medicine | Admitting: Family Medicine

## 2022-01-12 DIAGNOSIS — S22000A Wedge compression fracture of unspecified thoracic vertebra, initial encounter for closed fracture: Secondary | ICD-10-CM | POA: Diagnosis not present

## 2022-01-12 DIAGNOSIS — M40204 Unspecified kyphosis, thoracic region: Secondary | ICD-10-CM | POA: Diagnosis not present

## 2022-01-15 ENCOUNTER — Telehealth: Payer: Self-pay | Admitting: Family Medicine

## 2022-01-15 NOTE — Telephone Encounter (Signed)
Pt called to go over imaging results. She will be working until 2 so she would like a call after if possible. Please advise.  ?

## 2022-01-17 ENCOUNTER — Other Ambulatory Visit: Payer: Self-pay

## 2022-01-17 NOTE — Telephone Encounter (Signed)
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 ? ?Pt viewed results via Mychart after she called, ortho referral placed for compression fx.  ?

## 2022-01-28 ENCOUNTER — Telehealth: Payer: Self-pay | Admitting: Family Medicine

## 2022-01-28 NOTE — Telephone Encounter (Signed)
Patient states her cough has not gotten any better, she would like to know what else to do. Advised to come in for a re-assessment but pt refused because she was just here. Please advise.  ?

## 2022-01-28 NOTE — Telephone Encounter (Signed)
Dr. Etter Sjogren evaluated the patient. If she has no further suggestions, I would like to see her. Ty.  ?

## 2022-01-29 NOTE — Telephone Encounter (Signed)
Pt scheduled for 01/30/22 ?

## 2022-01-30 ENCOUNTER — Encounter: Payer: Self-pay | Admitting: Family Medicine

## 2022-01-30 ENCOUNTER — Ambulatory Visit (INDEPENDENT_AMBULATORY_CARE_PROVIDER_SITE_OTHER): Payer: PPO | Admitting: Family Medicine

## 2022-01-30 ENCOUNTER — Other Ambulatory Visit (HOSPITAL_BASED_OUTPATIENT_CLINIC_OR_DEPARTMENT_OTHER): Payer: Self-pay

## 2022-01-30 VITALS — BP 108/62 | HR 89 | Temp 98.0°F | Ht 64.0 in | Wt 131.2 lb

## 2022-01-30 DIAGNOSIS — R053 Chronic cough: Secondary | ICD-10-CM

## 2022-01-30 MED ORDER — FLUTICASONE PROPIONATE HFA 110 MCG/ACT IN AERO
2.0000 | INHALATION_SPRAY | Freq: Two times a day (BID) | RESPIRATORY_TRACT | 12 refills | Status: DC
  Filled 2022-01-30: qty 12, 30d supply, fill #0

## 2022-01-30 NOTE — Progress Notes (Signed)
Chief Complaint  ?Patient presents with  ? Cough  ?  Since February  ? ? ?Robin Maldonado is 71 y.o. and is here for a cough. ? ?Duration: 2 months ?Productive? No ?Associated symptoms: nasal congestion, rhinorrhea ?Denies: fever, facial pain, ST, chest pain, hemoptysis, dyspnea, wheezing ?Hx of GERD? No; not a/w meals ?Random coughing sometimes triggered by laughing or taking deep breaths.  ?She has never smoked but was married to a long time smoker. ?ACEi? No ? ?Past Medical History:  ?Diagnosis Date  ? No known health problems   ? ?Family History  ?Problem Relation Age of Onset  ? Cancer Mother 66  ?     colon  ? Heart disease Father   ? Stroke Brother   ? Diabetes Neg Hx   ? Hypertension Neg Hx   ? ? ?BP 108/62   Pulse 89   Temp 98 ?F (36.7 ?C) (Oral)   Ht '5\' 4"'$  (1.626 m)   Wt 131 lb 4 oz (59.5 kg)   SpO2 98%   BMI 22.53 kg/m?  ?Gen: Awake, alert, appears stated age ?HEENT: Ears neg, nares patent without D/C, turbinates unremarkable, Pharynx pink without exudate ?Neck: Supple, no masses or asymmetry, no tenderness ?Heart: RRR, no LE edema ?Lungs: CTAB, normal effort, no accessory muscle use; she would cough upon deep inspiration ?Psych: Age appropriate judgement and insight, normal mood and affect ? ?Chronic cough - Plan: Ambulatory referral to Pulmonology, fluticasone (FLOVENT HFA) 110 MCG/ACT inhaler ? ?Chronic, not controlled.  She is around the 74-monthmark, could be lingering postinfectious inflammation.  We will start inhaled corticosteroid, Flovent twice daily, remember to rinse mouth out after use.  Does not seem to be related to GERD or upper airway cough syndrome.  She will follow-up with me as originally scheduled as she plans to see the pulmonology team. ?The patient voiced understanding and agreement to the plan. ? ?NShelda Pal DO ?01/30/22 ?4:32 PM ? ?

## 2022-01-30 NOTE — Patient Instructions (Signed)
If you do not hear anything about your referral in the next 1-2 weeks, call our office and ask for an update.  Let us know if you need anything.  

## 2022-02-01 ENCOUNTER — Encounter: Payer: Self-pay | Admitting: Orthopaedic Surgery

## 2022-02-01 ENCOUNTER — Ambulatory Visit (INDEPENDENT_AMBULATORY_CARE_PROVIDER_SITE_OTHER): Payer: PPO

## 2022-02-01 ENCOUNTER — Ambulatory Visit: Payer: PPO | Admitting: Orthopaedic Surgery

## 2022-02-01 VITALS — BP 133/81 | HR 78 | Ht 64.0 in | Wt 131.0 lb

## 2022-02-01 DIAGNOSIS — M549 Dorsalgia, unspecified: Secondary | ICD-10-CM | POA: Diagnosis not present

## 2022-02-01 DIAGNOSIS — R29898 Other symptoms and signs involving the musculoskeletal system: Secondary | ICD-10-CM | POA: Diagnosis not present

## 2022-02-01 DIAGNOSIS — M542 Cervicalgia: Secondary | ICD-10-CM

## 2022-02-03 NOTE — Progress Notes (Signed)
? ?Office Visit Note ?  ?Patient: Robin Maldonado           ?Date of Birth: 1950-11-19           ?MRN: [id number] ?Visit Date: 02/01/2022 ?             ?Requested by: Carollee Herter, Kendrick Fries R, DO ?[address] ?[address] ?[place],  [place] [address] ?PCP: [name], DO ? ? ?Assessment & Plan: ?Visit Diagnoses:  ?1. Mid back pain   ?2. Neck pain   ?3. Cervicalgia   ?4. Weakness of right upper extremity   ? ? ?Plan: X-rays reviewed with patient with her spondylosis.  She has weakness of grip with normal ulnar nerve at the elbow.  Spondylitic changes primarily C5-6 on plain radiographs.  With patient's grip and biceps triceps weakness on the right would recommend proceeding with MRI scan cervical spine office follow-up after scan for review. ? ?Follow-Up Instructions: Return if symptoms worsen or fail to improve.  ? ?Orders:  ?Orders Placed This Encounter  ?Procedures  ? XR Thoracic Spine 2 View  ? XR Cervical Spine 2 or 3 views  ? MR Cervical Spine w/o contrast  ? ?No orders of the defined types were placed in this encounter. ? ? ? ? Procedures: ?No procedures performed ? ? ?Clinical Data: ?No additional findings. ? ? ?Subjective: ?Chief Complaint  ?Patient presents with  ? Middle Back - Fracture  ? ? ?HPI 71 year old female new patient visit for thoracic compression fractures.  She has T9 fracture noted on MRI scan with previous T11 and T12 compression fractures.  Negative history for falls.  Patient has midthoracic pain below her bra strap that radiates right and left equally.  MRI scan 01/13/2022 showed T9 25% height loss without retropulsion.  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 ? ?Patient works in [name] physical activity does lifting.  She still working started Fosamax 4 months ago and is taking it as recommended.  She has noticed weakness in the right arm with activity and weakness in her grip. ? ?Review of Systems past history of osteoporosis.  Negative fever or chills.   Negative bowel or bladder associated symptoms.  Negative myelopathic symptoms.  All other systems noncontributory to HPI. ? ? ?Objective: ?Vital Signs: BP 133/81   Pulse 78   Ht '5\' 4"'$  (1.626 m)   Wt 131 lb (59.4 kg)   BMI 22.49 kg/m?  ? ?Physical Exam ?Constitutional:   ?   Appearance: She is well-developed.  ?HENT:  ?   Head: Normocephalic.  ?   Right Ear: External ear normal.  ?   Left Ear: External ear normal. There is no impacted cerumen.  ?Eyes:  ?   Pupils: Pupils are equal, round, and reactive to light.  ?Neck:  ?   Thyroid: No thyromegaly.  ?   Trachea: No tracheal deviation.  ?Cardiovascular:  ?   Rate and Rhythm: Normal rate.  ?Pulmonary:  ?   Effort: Pulmonary effort is normal.  ?Abdominal:  ?   Palpations: Abdomen is soft.  ?Musculoskeletal:  ?   Cervical back: No rigidity.  ?Skin: ?   General: Skin is warm and dry.  ?Neurological:  ?   Mental Status: She is alert and oriented to person, place, and time.  ?Psychiatric:     ?   Behavior: Behavior normal.  ? ? ?Ortho Exam patient intact lower extremity reflexes.  EHL anterior tib gastrocsoleus is intact.  No  lower extremity atrophy.  Some tenderness T9-T12 region.  Po.  Patient has weakness of the grip on the right versus left no thumb or wrist extension weakness.  Interossei are strong.  Triceps and biceps showed trace weakness on the right normal on the left.  Sitive brachial plexus tenderness right side ? ?Specialty Comments:  ?No specialty comments available. ? ?Imaging: ?No results found. ? ? ?PMFS History: ?Patient Active Problem List  ? Diagnosis Date Noted  ? Bronchitis 01/10/2022  ? Age-related osteoporosis without current pathological fracture 08/24/2021  ? Refused Streptococcus pneumoniae vaccination 02/04/2018  ? Tetanus toxoid vaccination refused 02/04/2018  ? Recommendation refused by patient 02/04/2018  ? Right knee injury, subsequent encounter 09/23/2017  ? SHOULDER PAIN, RIGHT 07/14/2009  ? ?Past Medical History:  ?Diagnosis Date  ?  No known health problems   ?  ?Family History  ?Problem Relation Age of Onset  ? Cancer Mother 56  ?     colon  ? Heart disease Father   ? Stroke Brother   ? Diabetes Neg Hx   ? Hypertension Neg Hx   ?  ?Past Surgical History:  ?Procedure Laterality Date  ? CRYOTHERAPY    ? ?Social History  ? ?Occupational History  ?  Comment: retired Physicist, medical  ?Tobacco Use  ? Smoking status: Never  ? Smokeless tobacco: Never  ?Substance and Sexual Activity  ? Alcohol use: No  ?  Alcohol/week: 0.0 standard drinks  ? Drug use: No  ? Sexual activity: Never  ? ? ? ? ? ? ?

## 2022-02-05 ENCOUNTER — Telehealth (HOSPITAL_BASED_OUTPATIENT_CLINIC_OR_DEPARTMENT_OTHER): Payer: Self-pay

## 2022-02-16 ENCOUNTER — Ambulatory Visit (HOSPITAL_BASED_OUTPATIENT_CLINIC_OR_DEPARTMENT_OTHER): Payer: PPO

## 2022-02-23 ENCOUNTER — Ambulatory Visit (HOSPITAL_BASED_OUTPATIENT_CLINIC_OR_DEPARTMENT_OTHER)
Admission: RE | Admit: 2022-02-23 | Discharge: 2022-02-23 | Disposition: A | Discharge status: Home / Self Care | Payer: PPO | Source: Ambulatory Visit | Attending: Orthopaedic Surgery | Admitting: Orthopaedic Surgery

## 2022-02-23 DIAGNOSIS — M542 Cervicalgia: Secondary | ICD-10-CM | POA: Insufficient documentation

## 2022-02-23 DIAGNOSIS — R29898 Other symptoms and signs involving the musculoskeletal system: Secondary | ICD-10-CM | POA: Diagnosis not present

## 2022-02-26 ENCOUNTER — Encounter: Payer: Self-pay | Admitting: Orthopaedic Surgery

## 2022-02-26 ENCOUNTER — Ambulatory Visit: Payer: PPO | Admitting: Orthopaedic Surgery

## 2022-02-26 DIAGNOSIS — M5020 Other cervical disc displacement, unspecified cervical region: Secondary | ICD-10-CM | POA: Diagnosis not present

## 2022-02-26 NOTE — Progress Notes (Signed)
? ?Office Visit Note ?  ?Patient: Robin Maldonado           ?Date of Birth: Mar 15, 1951           ?MRN: [id number] ?Visit Date: 02/26/2022 ?             ?Requested by: [name], DO ?[facility] [address] ?[address] ?[place],  [place] [address] ?PCP: [name], DO ? ? ?Assessment & Plan: ?Visit Diagnoses:  ?1. Protrusion of cervical intervertebral disc   ? ? ?Plan: Patient has some mild foraminal narrowing but no areas of significant compression of the cervical spine.  She can call if she like to proceed with some physical therapy for strengthening exercises otherwise office follow-up if her symptoms increase.  MRI scan and report were reviewed and pathophysiology discussed. ? ?Follow-Up Instructions: No follow-ups on file.  ? ?Orders:  ?No orders of the defined types were placed in this encounter. ? ?No orders of the defined types were placed in this encounter. ? ? ? ? Procedures: ?No procedures performed ? ? ?Clinical Data: ?No additional findings. ? ? ?Subjective: ?Chief Complaint  ?Patient presents with  ? Neck - Pain, Follow-up  ? ? ?HPI 71 year old female with persistent problems with neck pain so weakness in the grip left ulnar nerve with some spondylitic changes at C5-6.  Most of her symptoms have been between her shoulder blades.  MRI scan has been obtained which shows some foraminal narrowing and mild disc protrusion C5-6 C6-7 without any areas of severe compression.  Past history of multiple compression fractures, T9 and T11-T12. ? ?Review of Systems all the systems noncontributory to HPI.  Positive for osteoporosis. ? ? ?Objective: ?Vital Signs: BP 123/80   Pulse 77   Ht '5\' 4"'$  (1.626 m)   Wt 131 lb (59.4 kg)   BMI 22.49 kg/m?  ? ?Physical Exam ?Constitutional:   ?   Appearance: She is well-developed.  ?HENT:  ?   Head: Normocephalic.  ?   Right Ear: External ear normal.  ?   Left Ear: External ear normal. There is no impacted cerumen.  ?Eyes:  ?   Pupils: Pupils are equal, round,  and reactive to light.  ?Neck:  ?   Thyroid: No thyromegaly.  ?   Trachea: No tracheal deviation.  ?Cardiovascular:  ?   Rate and Rhythm: Normal rate.  ?Pulmonary:  ?   Effort: Pulmonary effort is normal.  ?Abdominal:  ?   Palpations: Abdomen is soft.  ?Musculoskeletal:  ?   Cervical back: No rigidity.  ?Skin: ?   General: Skin is warm and dry.  ?Neurological:  ?   Mental Status: She is alert and oriented to person, place, and time.  ?Psychiatric:     ?   Behavior: Behavior normal.  ? ? ?Ortho Exam normal heel-toe gait pattern.  She gets from sitting standing comfortably.  Negative Spurling right and left.  No atrophy of the upper extremities. ? ?Specialty Comments:  ?No specialty comments available. ? ?Imaging: ?CLINICAL DATA:  Neck pain and chronic right upper extremity weakness ?  ?EXAM: ?MRI CERVICAL SPINE WITHOUT CONTRAST ?  ?TECHNIQUE: ?Multiplanar, multisequence MR imaging of the cervical spine was ?performed. No intravenous contrast was administered. ?  ?COMPARISON:  None. ?  ?FINDINGS: ?Alignment: Borderline C5-6 retrolisthesis ?  ?Vertebrae: No fracture, evidence of discitis, or bone lesion. ?  ?Cord: Normal signal and morphology. ?  ?Posterior Fossa, vertebral arteries, paraspinal tissues: Mild ?opacification of a posterior  sphenoid sinus. ?  ?Disc levels: ?  ?C2-3: Unremarkable. ?  ?C3-4: Unremarkable. ?  ?C4-5: Mild disc narrowing and bulging with posterior annular fissure ?  ?C5-6: Disc narrowing and bulging with small uncovertebral spurs. ?Mild right foraminal narrowing ?  ?C6-7: Mild disc narrowing with small central protrusion. Mild right ?facet spurring ?  ?C7-T1:Unremarkable. ?  ?IMPRESSION: ?1. Mild for age degenerative change with up to mild foraminal ?narrowing on the right at C5-6. ?2. Diffusely patent spinal canal. ?  ?  ?Electronically Signed ?  By: Jorje Guild M.D. ?  On: 02/24/2022 04:28 ? ? ?PMFS History: ?Patient Active Problem List  ? Diagnosis Date Noted  ? Protrusion of cervical  intervertebral disc 02/26/2022  ? Bronchitis 01/10/2022  ? Age-related osteoporosis without current pathological fracture 08/24/2021  ? Refused Streptococcus pneumoniae vaccination 02/04/2018  ? Tetanus toxoid vaccination refused 02/04/2018  ? Recommendation refused by patient 02/04/2018  ? Right knee injury, subsequent encounter 09/23/2017  ? SHOULDER PAIN, RIGHT 07/14/2009  ? ?Past Medical History:  ?Diagnosis Date  ? No known health problems   ?  ?Family History  ?Problem Relation Age of Onset  ? Cancer Mother 49  ?     colon  ? Heart disease Father   ? Stroke Brother   ? Diabetes Neg Hx   ? Hypertension Neg Hx   ?  ?Past Surgical History:  ?Procedure Laterality Date  ? CRYOTHERAPY    ? ?Social History  ? ?Occupational History  ?  Comment: retired Physicist, medical  ?Tobacco Use  ? Smoking status: Never  ? Smokeless tobacco: Never  ?Substance and Sexual Activity  ? Alcohol use: No  ?  Alcohol/week: 0.0 standard drinks  ? Drug use: No  ? Sexual activity: Never  ? ? ? ? ? ? ?

## 2022-03-04 ENCOUNTER — Institutional Professional Consult (permissible substitution): Payer: PPO | Admitting: Pulmonary Disease

## 2022-03-26 DIAGNOSIS — L820 Inflamed seborrheic keratosis: Secondary | ICD-10-CM | POA: Diagnosis not present

## 2022-05-02 DIAGNOSIS — J209 Acute bronchitis, unspecified: Secondary | ICD-10-CM | POA: Diagnosis not present

## 2022-05-14 ENCOUNTER — Other Ambulatory Visit: Payer: Self-pay | Admitting: Family Medicine

## 2022-05-14 ENCOUNTER — Telehealth: Payer: Self-pay | Admitting: Family Medicine

## 2022-05-14 DIAGNOSIS — H2513 Age-related nuclear cataract, bilateral: Secondary | ICD-10-CM | POA: Diagnosis not present

## 2022-05-14 DIAGNOSIS — R053 Chronic cough: Secondary | ICD-10-CM

## 2022-05-14 NOTE — Telephone Encounter (Signed)
Referral done to allergist.

## 2022-05-14 NOTE — Telephone Encounter (Signed)
Patient would like to get an allergist referral to figure out the cause of her recurrent bronchitis. She is aware Dr. Nani Ravens recommended a lung specialist but she'd like to start with an allergist. Please advise.

## 2022-06-11 NOTE — Progress Notes (Unsigned)
NEW PATIENT Date of Service/Encounter:  06/13/22 Referring provider: [name]* Primary care provider: [name], DO  Subjective:  Robin Maldonado is a 71 y.o. female with a PMHx of osteoporosis presenting today for evaluation of chronic cough.  History obtained from: chart review and patient.   Frequent bronchitis:  She has had 3 bronchitis since last September.   This has been a recurrent problem for the past few years. She is being treated with prednisone and albuterol as needed.  She was given Flovent 110, 2 puffs twice daily a few years ago, but doesn't use it because she doesn't feel it was helpful. Never diagnosed with asthma or allergies in her childhood or as an adult. She will get sinusitis along with bronchitis, has not been diagnosed with pneumonia. Has only been treated with antibiotics once.  Never hospitalized for an infection. Never required IM or IV antibiotics for an infection. Does not take allergy medications on a regular basis-sudafed or benadryl as needed.  Last infection in March.   She denies personal smoking history, did have second smoke exposure from her husband who smokes in home and cars, etc.  Chart review:  PCP visit on 01/30/22: chronic cough, not controlled.  Flovent 110, 2puffs BID started.  Plan to follow-up with pulmonary. Patient requested allergist.    On review of problems. Refused strep and tetanus vaccine boosters.  Labs:  Pathology - ascending colon-tubular adenoma CBCd 06/2021- AEC 100  Imaging:  CXR 01/10/22: Impression: The heart size and mediastinal contours are within normal limits. Both lungs are clear. Several mid and lower thoracic vertebral body wedge compression deformities are noted.  Images reviewed by me, agree with lung findings.   Other allergy screening: Asthma: no Rhino conjunctivitis: no Food allergy: no Hymenoptera allergy: no Urticaria: no Eczema:no  She did receive her  childhood vaccines, but has not received any adult vaccines.  No specific reason given for not obtaining except not following-up on these.    Additionally she reports an itchy area under her right scapula without any underlying rash.    Past Medical History: Past Medical History:  Diagnosis Date   No known health problems    Medication List:  Current Outpatient Medications  Medication Sig Dispense Refill   alendronate (FOSAMAX) 70 MG tablet Take 1 tablet (70 mg total) by mouth every 7 (seven) days. Take with a full glass of water on an empty stomach. 4 tablet 11   albuterol (VENTOLIN HFA) 108 (90 Base) MCG/ACT inhaler SMARTSIG:By Mouth (Patient not taking: Reported on 06/13/2022)     fluticasone (FLOVENT HFA) 110 MCG/ACT inhaler Inhale 2 puffs by mouth into the lungs in the morning and at bedtime. (Patient not taking: Reported on 06/13/2022) 12 g 12   No current facility-administered medications for this visit.   Known Allergies:  Allergies  Allergen Reactions   Hydrocodone-Acetaminophen Nausea And Vomiting   Tessalon [Benzonatate] Nausea And Vomiting   Past Surgical History: Past Surgical History:  Procedure Laterality Date   CRYOTHERAPY     Family History: Family History  Problem Relation Age of Onset   Cancer Mother 76       colon   Heart disease Father    Stroke Brother    Diabetes Neg Hx    Hypertension Neg Hx    Social History: Robin Maldonado lives in a house built 4 years ago, no water damage, carpet and vinyl floors, central AC, 3 cats and 1 dog, no cockroaches, using dust mite  protection on the bedding but not pillows, no smoke exposure.  She is a retired Insurance claims handler.  + HEPA filter in the home.  [place]   ROS:  All other systems negative except as noted per HPI.  Objective:  Blood pressure 114/62, pulse 87, temperature 97.6 F (36.4 C), temperature source Temporal, resp. rate 17, height '5\' 5"'$  (1.651 m), weight 131 lb 1.6 oz  (59.5 kg), SpO2 99 %. Body mass index is 21.82 kg/m. Physical Exam:  General Appearance:  Alert, cooperative, no distress, appears stated age  Head:  Normocephalic, without obvious abnormality, atraumatic  Eyes:  Conjunctiva clear, EOM's intact  Nose: Nares normal, normal mucosa and no visible anterior polyps  Throat: Lips, tongue normal; teeth and gums normal, normal posterior oropharynx  Neck: Supple, symmetrical  Lungs:   clear to auscultation bilaterally, Respirations unlabored, no coughing  Heart:  regular rate and rhythm and no murmur, Appears well perfused  Extremities: No edema  Skin: Skin color, texture, turgor normal, excoriations under left scapula without other rash present  Neurologic: No gross deficits     Diagnostics: Spirometry:  Tracings reviewed. His effort: Good reproducible efforts. FVC: 2.85L  FEV1: 2.24L, 99% predicted  FEV1/FVC ratio: 101% Interpretation: Spirometry consistent with normal pattern   Skin Testing: Environmental allergy panel.  Adequate controls. Results discussed with patient/family.  Airborne Adult Perc - 06/13/22 1408     Time Antigen Placed 1408    Allergen Manufacturer Lavella Hammock    Location Back    Number of Test 59    Panel 1 Select    1. Control-Buffer 50% Glycerol Negative    2. Control-Histamine 1 mg/ml 3+    3. Albumin saline Negative    4. [place] Negative    5. [place] Negative    6. Johnson Negative    7. [place] Blue Negative    8. Meadow Fescue Negative    9. Perennial Rye Negative    10. Sweet Vernal Negative    11. Timothy Negative    12. Cocklebur Negative    13. Burweed Marshelder Negative    14. Ragweed, short Negative    15. Ragweed, Giant Negative    16. Plantain,  English Negative    17. Lamb's Quarters Negative    18. Sheep Sorrell Negative    19. Rough Pigweed Negative    20. Marsh Elder, Rough Negative    21. Mugwort, Common Negative    22. Ash mix Negative    23. Birch mix Negative    24. Beech  American Negative    25. Box, Elder Negative    26. Cedar, red Negative    27. Cottonwood, [place] Negative    28. Elm mix Negative    29. Hickory Negative    30. Maple mix Negative    31. Oak, [place] mix Negative    32. Pecan Pollen Negative    33. Pine mix Negative    34. Sycamore Eastern Negative    35. [place], Black Pollen Negative    36. Alternaria alternata Negative    37. Cladosporium Herbarum Negative    38. Aspergillus mix Negative    39. Penicillium mix Negative    40. Bipolaris sorokiniana (Helminthosporium) Negative    41. Drechslera spicifera (Curvularia) Negative    42. Mucor plumbeus Negative    43. Fusarium moniliforme Negative    44. Aureobasidium pullulans (pullulara) Negative    45. Rhizopus oryzae Negative    46. Botrytis cinera Negative  47. Epicoccum nigrum Negative    48. Phoma betae Negative    49. Candida Albicans Negative    50. Trichophyton mentagrophytes Negative    51. Mite, D Farinae  5,000 AU/ml Negative    52. Mite, D Pteronyssinus  5,000 AU/ml Negative    53. Cat Hair 10,000 BAU/ml Negative    54.  Dog Epithelia Negative    55. Mixed Feathers Negative    56. Horse Epithelia Negative    57. Cockroach, German Negative    58. Mouse Negative    59. Tobacco Leaf Negative             Allergy testing results were read and interpreted by myself, documented by clinical staff.  Assessment and Plan  History of recurrent bronchitis:   - environmental allergy testing was negative, do not suspect allergies based on history and testing - your lung testing today was normal; your lack of response to flovent and albuterol make asthma less likely - we will obtain labs today to ensure your immune system is functioning properly - we will contact you once results have returned. She has not had adult booster vaccines and we discussed that if titers low, recommendation will be vaccination which she was in agreement with.   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    Follow-up as needed.   It was a pleasure meeting you in clinic today.  Sigurd Sos, MD [facility]   This note in its entirety was forwarded to the Provider who requested this consultation.  Thank you for your kind referral. I appreciate the opportunity to take part in Moriya's care. Please do not hesitate to contact me with questions.  Sincerely,  Sigurd Sos, MD [facility]

## 2022-06-13 ENCOUNTER — Encounter: Payer: Self-pay | Admitting: Internal Medicine

## 2022-06-13 ENCOUNTER — Ambulatory Visit (INDEPENDENT_AMBULATORY_CARE_PROVIDER_SITE_OTHER): Payer: PPO | Admitting: Internal Medicine

## 2022-06-13 VITALS — BP 114/62 | HR 87 | Temp 97.6°F | Resp 17 | Ht 65.0 in | Wt 131.1 lb

## 2022-06-13 DIAGNOSIS — J40 Bronchitis, not specified as acute or chronic: Secondary | ICD-10-CM | POA: Diagnosis not present

## 2022-06-13 DIAGNOSIS — B999 Unspecified infectious disease: Secondary | ICD-10-CM

## 2022-06-13 DIAGNOSIS — R053 Chronic cough: Secondary | ICD-10-CM | POA: Diagnosis not present

## 2022-06-13 DIAGNOSIS — R202 Paresthesia of skin: Secondary | ICD-10-CM

## 2022-06-13 NOTE — Patient Instructions (Signed)
History of recurrent bronchitis:   - environmental allergy testing was negative, do not suspect allergies based on history and testing - your lung testing today was normal; your lack of response to flovent and albuterol make asthma less likely - we will obtain labs today to ensure your immune system is functioning properly - we will contact you once results have returned.  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    Follow-up as needed.   It was a pleasure meeting you in clinic today.  Sigurd Sos, MD [facility]

## 2022-07-10 DIAGNOSIS — H00025 Hordeolum internum left lower eyelid: Secondary | ICD-10-CM | POA: Diagnosis not present

## 2022-08-03 IMAGING — MR MR CERVICAL SPINE W/O CM
5 series · 35 of 48 positions shown · non-contrast
Comparison: None.

CLINICAL DATA: Neck pain and chronic right upper extremity weakness

EXAM:
MRI CERVICAL SPINE WITHOUT CONTRAST
TECHNIQUE: Multiplanar, multisequence MR imaging of the cervical spine was
performed. No intravenous contrast was administered.

[Series 2: T2 · sagittal · 3.0mm · 0.69mm/px · 6 of 14 slices shown (1 of 3)]
[im 1/14]
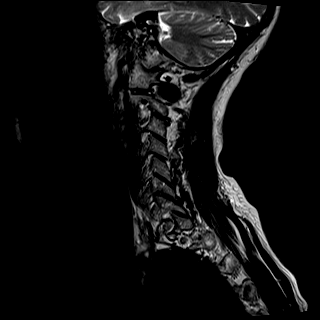
[im 3/14]
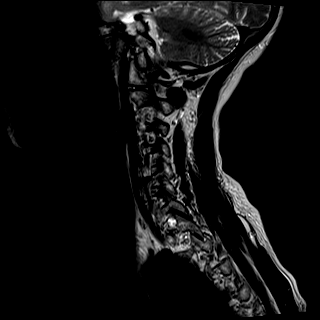
[im 6/14]
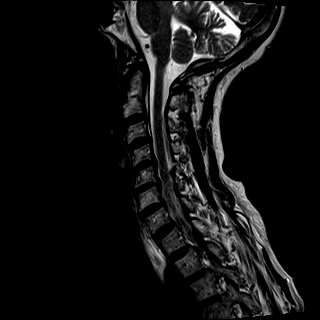
[im 8/14]
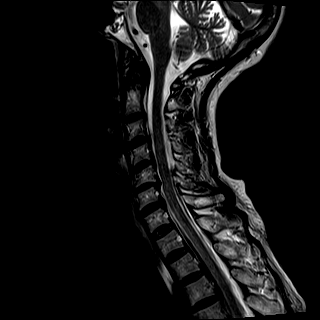
[im 11/14]
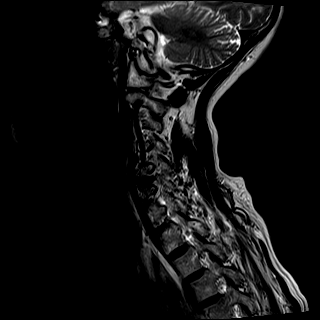
[im 14/14]
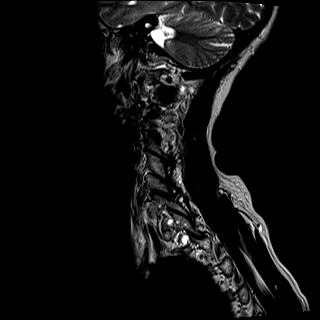

[Series 3: T1 · sagittal · 3.0mm · 0.69mm/px · 7 of 14 slices shown]
[im 1/14]
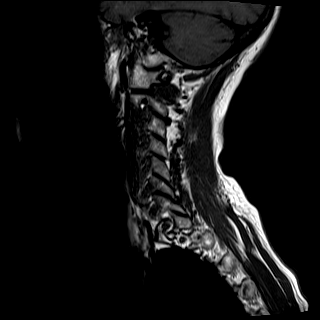
[im 3/14]
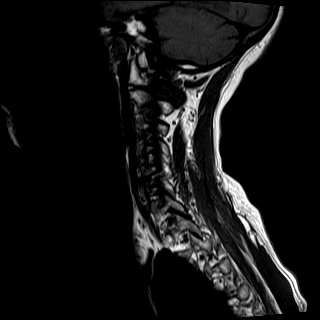
[im 5/14]
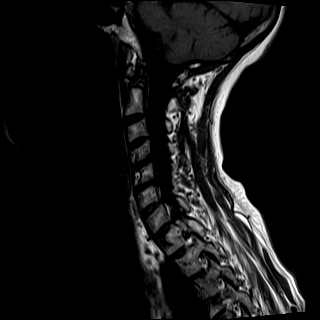
[im 7/14]
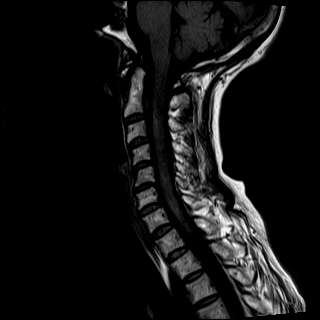
[im 9/14]
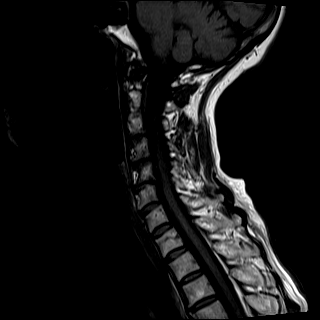
[im 11/14]
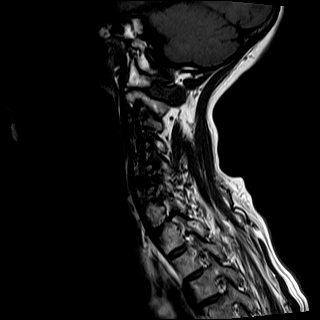
[im 14/14]
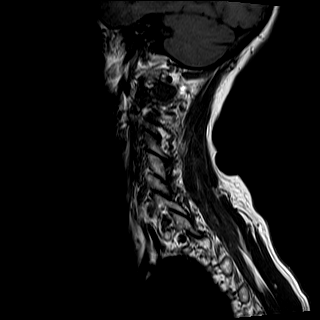

[Series 4: STIR · sagittal · 3.0mm · 0.69mm/px · 6 of 14 slices shown]
[im 1/14]
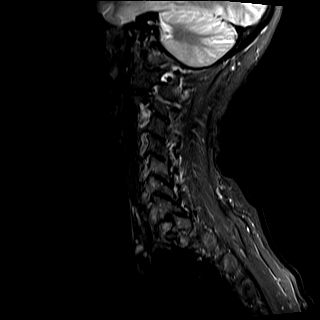
[im 3/14]
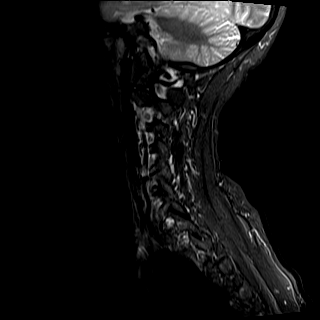
[im 5/14]
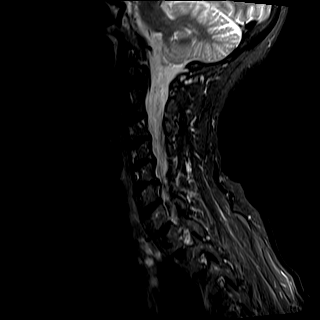
[im 7/14]
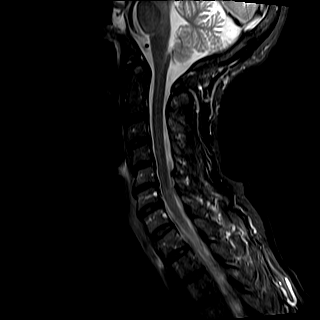
[im 9/14]
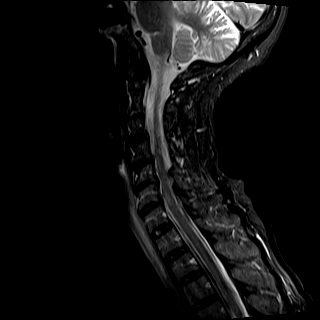
[im 11/14]
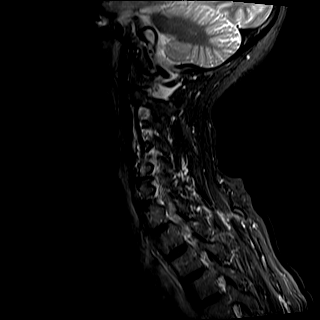

[Series 5: T2 · axial · 3.0mm · 0.62mm/px · z∈[-110,-14]mm · 8 of 29 slices shown (2 of 3)]
[im 1/29]
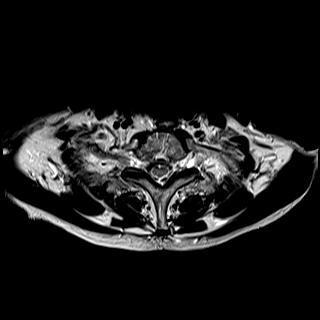
[im 5/29]
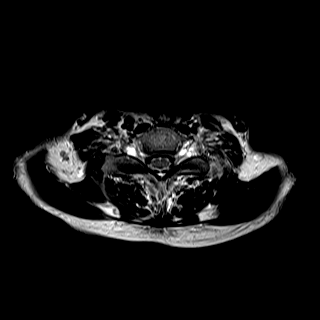
[im 9/29]
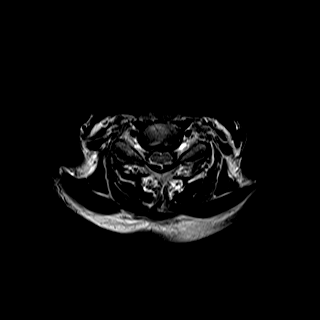
[im 13/29]
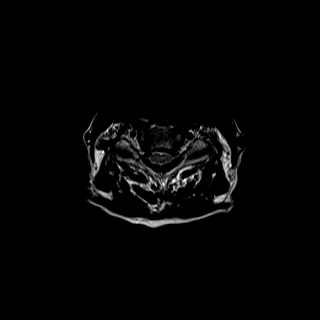
[im 16/29]
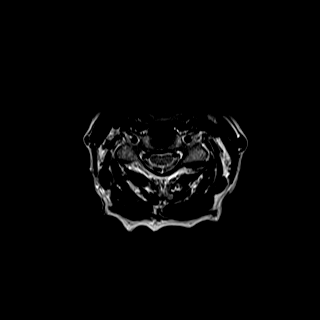
[im 20/29]
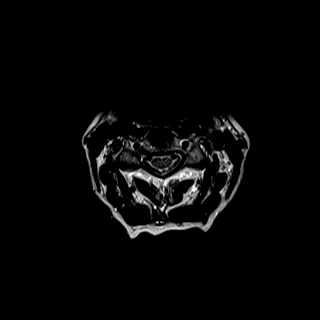
[im 24/29]
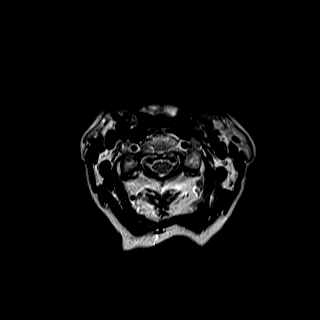
[im 29/29]
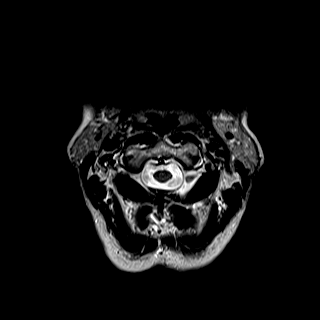

[Series 6: T2 · axial · 3.0mm · 0.39mm/px · z∈[-110,-14]mm · 8 of 29 slices shown (3 of 3)]
[im 1/29]
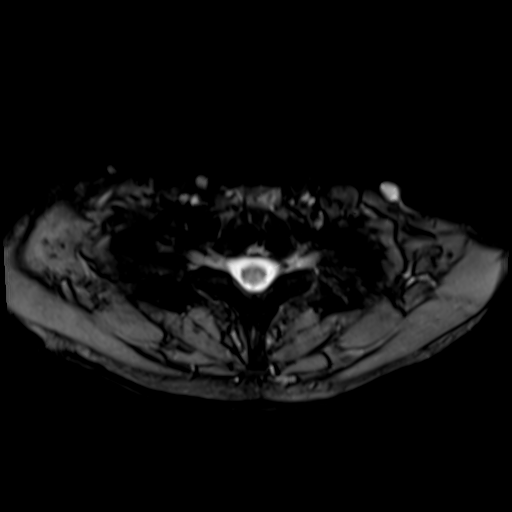
[im 5/29]
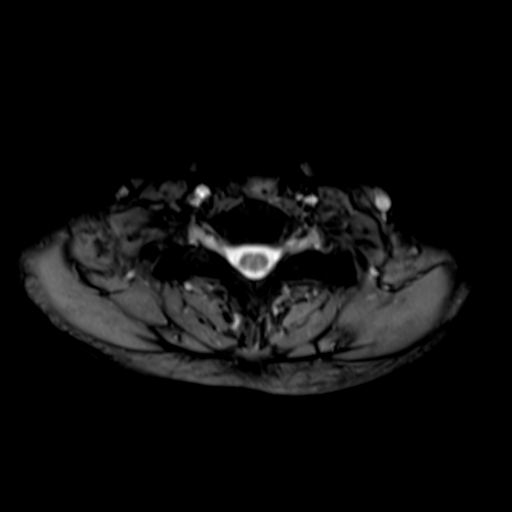
[im 9/29]
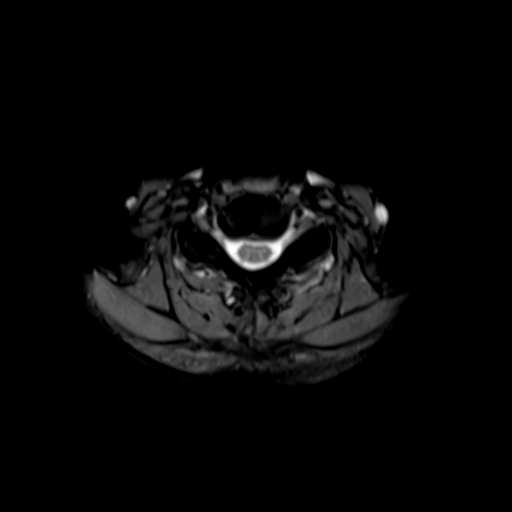
[im 13/29]
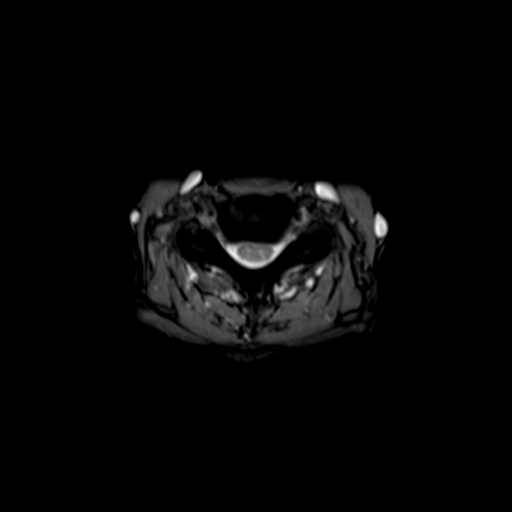
[im 16/29]
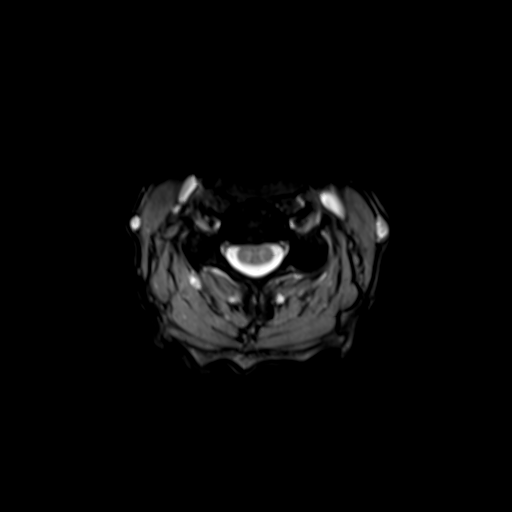
[im 20/29]
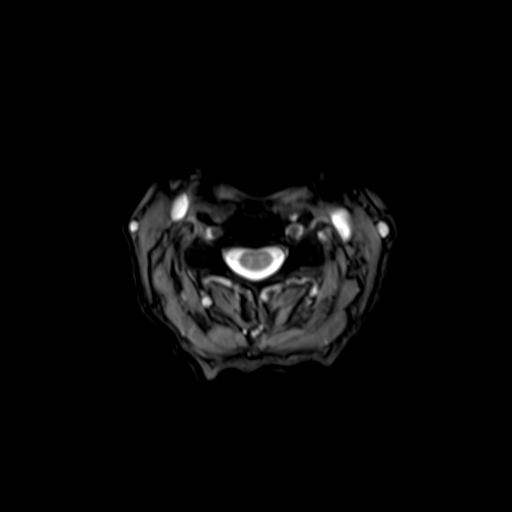
[im 24/29]
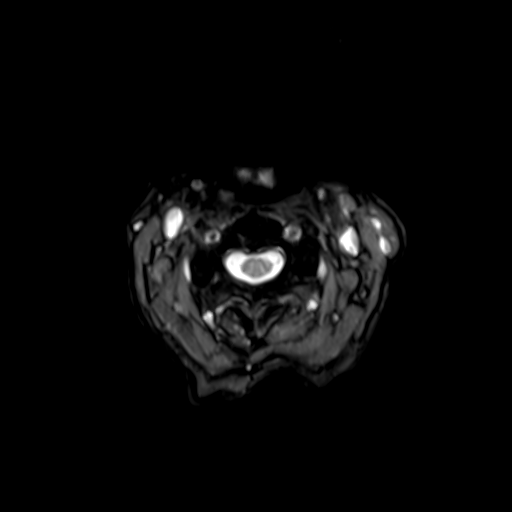
[im 29/29]
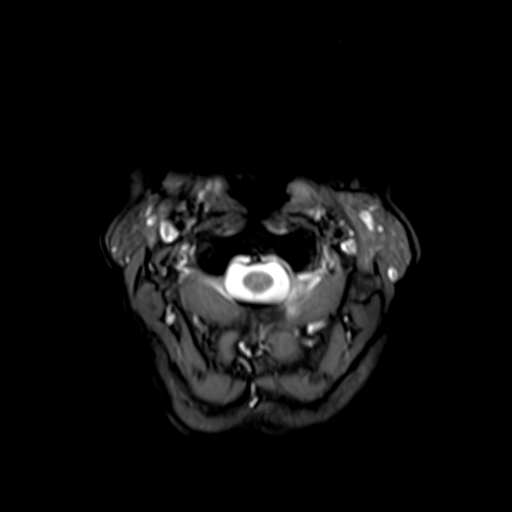

[35 of 48 positions shown; findings below may reference images not displayed]

FINDINGS: Alignment: Borderline C5-6 retrolisthesis

Vertebrae: No fracture, evidence of discitis, or bone lesion.

Cord: Normal signal and morphology.

Posterior Fossa, vertebral arteries, paraspinal tissues: Mild
opacification of a posterior sphenoid sinus.

Disc levels:

C2-3: Unremarkable.

C3-4: Unremarkable.

C4-5: Mild disc narrowing and bulging with posterior annular fissure

C5-6: Disc narrowing and bulging with small uncovertebral spurs.
Mild right foraminal narrowing

C6-7: Mild disc narrowing with small central protrusion. Mild right
facet spurring

C7-T1:Unremarkable.
IMPRESSION: 1. Mild for age degenerative change with up to mild foraminal
narrowing on the right at C5-6.
2. Diffusely patent spinal canal.

## 2022-08-06 ENCOUNTER — Telehealth: Payer: Self-pay | Admitting: Family Medicine

## 2022-08-06 MED ORDER — ALENDRONATE SODIUM 70 MG PO TABS: 70.0000 mg | ORAL_TABLET | ORAL | 11 refills | Status: DC

## 2022-08-06 NOTE — Telephone Encounter (Signed)
Refill done.  

## 2022-09-02 DIAGNOSIS — D3102 Benign neoplasm of left conjunctiva: Secondary | ICD-10-CM | POA: Diagnosis not present

## 2022-09-02 DIAGNOSIS — H0279 Other degenerative disorders of eyelid and periocular area: Secondary | ICD-10-CM | POA: Diagnosis not present

## 2022-09-02 DIAGNOSIS — D485 Neoplasm of uncertain behavior of skin: Secondary | ICD-10-CM | POA: Diagnosis not present

## 2022-09-19 ENCOUNTER — Other Ambulatory Visit (HOSPITAL_BASED_OUTPATIENT_CLINIC_OR_DEPARTMENT_OTHER): Payer: Self-pay | Admitting: Family Medicine

## 2022-09-19 DIAGNOSIS — Z1231 Encounter for screening mammogram for malignant neoplasm of breast: Secondary | ICD-10-CM

## 2022-09-23 ENCOUNTER — Encounter (HOSPITAL_BASED_OUTPATIENT_CLINIC_OR_DEPARTMENT_OTHER): Payer: Self-pay

## 2022-09-23 ENCOUNTER — Ambulatory Visit (HOSPITAL_BASED_OUTPATIENT_CLINIC_OR_DEPARTMENT_OTHER)
Admission: RE | Admit: 2022-09-23 | Discharge: 2022-09-23 | Disposition: A | Discharge status: Home / Self Care | Payer: PPO | Source: Ambulatory Visit | Attending: Family Medicine | Admitting: Family Medicine

## 2022-09-23 DIAGNOSIS — Z1231 Encounter for screening mammogram for malignant neoplasm of breast: Secondary | ICD-10-CM | POA: Diagnosis not present

## 2022-11-22 ENCOUNTER — Ambulatory Visit (INDEPENDENT_AMBULATORY_CARE_PROVIDER_SITE_OTHER): Payer: PPO | Admitting: *Deleted

## 2022-11-22 DIAGNOSIS — Z0000 Encounter for general adult medical examination without abnormal findings: Secondary | ICD-10-CM

## 2022-11-22 NOTE — Patient Instructions (Signed)
Robin Maldonado , Thank you for taking time to come for your Medicare Wellness Visit. I appreciate your ongoing commitment to your health goals. Please review the following plan we discussed and let me know if I can assist you in the future.   These are the goals we discussed:  Goals      Patient Stated     Maintain healthy active lifestyle.        This is a list of the screening recommended for you and due dates:  Health Maintenance  Topic Date Due   Flu Shot  02/02/2023*   Pneumonia Vaccine (1 - PCV) 11/23/2023*   COVID-19 Vaccine (1) 11/22/2025*   Medicare Annual Wellness Visit  11/23/2023   Mammogram  09/23/2024   Colon Cancer Screening  05/04/2028   DTaP/Tdap/Td vaccine (2 - Td or Tdap) 07/04/2031   DEXA scan (bone density measurement)  Completed   Hepatitis C Screening: USPSTF Recommendation to screen - Ages 81-79 yo.  Completed   HPV Vaccine  Aged Out   Zoster (Shingles) Vaccine  Discontinued  *Topic was postponed. The date shown is not the original due date.     Next appointment: Follow up in one year for your annual wellness visit.   Preventive Care 47 Years and Older, Female Preventive care refers to lifestyle choices and visits with your health care provider that can promote health and wellness. What does preventive care include? A yearly physical exam. This is also called an annual well check. Dental exams once or twice a year. Routine eye exams. Ask your health care provider how often you should have your eyes checked. Personal lifestyle choices, including: Daily care of your teeth and gums. Regular physical activity. Eating a healthy diet. Avoiding tobacco and drug use. Limiting alcohol use. Practicing safe sex. Taking low-dose aspirin every day. Taking vitamin and mineral supplements as recommended by your health care provider. What happens during an annual well check? The services and screenings done by your health care provider during your annual well check  will depend on your age, overall health, lifestyle risk factors, and family history of disease. Counseling  Your health care provider may ask you questions about your: Alcohol use. Tobacco use. Drug use. Emotional well-being. Home and relationship well-being. Sexual activity. Eating habits. History of falls. Memory and ability to understand (cognition). Work and work Statistician. Reproductive health. Screening  You may have the following tests or measurements: Height, weight, and BMI. Blood pressure. Lipid and cholesterol levels. These may be checked every 5 years, or more frequently if you are over 54 years old. Skin check. Lung cancer screening. You may have this screening every year starting at age 52 if you have a 30-pack-year history of smoking and currently smoke or have quit within the past 15 years. Fecal occult blood test (FOBT) of the stool. You may have this test every year starting at age 16. Flexible sigmoidoscopy or colonoscopy. You may have a sigmoidoscopy every 5 years or a colonoscopy every 10 years starting at age 22. Hepatitis C blood test. Hepatitis B blood test. Sexually transmitted disease (STD) testing. Diabetes screening. This is done by checking your blood sugar (glucose) after you have not eaten for a while (fasting). You may have this done every 1-3 years. Bone density scan. This is done to screen for osteoporosis. You may have this done starting at age 28. Mammogram. This may be done every 1-2 years. Talk to your health care provider about how often you should have  regular mammograms. Talk with your health care provider about your test results, treatment options, and if necessary, the need for more tests. Vaccines  Your health care provider may recommend certain vaccines, such as: Influenza vaccine. This is recommended every year. Tetanus, diphtheria, and acellular pertussis (Tdap, Td) vaccine. You may need a Td booster every 10 years. Zoster vaccine. You  may need this after age 29. Pneumococcal 13-valent conjugate (PCV13) vaccine. One dose is recommended after age 73. Pneumococcal polysaccharide (PPSV23) vaccine. One dose is recommended after age 41. Talk to your health care provider about which screenings and vaccines you need and how often you need them. This information is not intended to replace advice given to you by your health care provider. Make sure you discuss any questions you have with your health care provider. Document Released: 11/17/2015 Document Revised: 07/10/2016 Document Reviewed: 08/22/2015 Elsevier Interactive Patient Education  2017 [place] Prevention in the Home Falls can cause injuries. They can happen to people of all ages. There are many things you can do to make your home safe and to help prevent falls. What can I do on the outside of my home? Regularly fix the edges of walkways and driveways and fix any cracks. Remove anything that might make you trip as you walk through a door, such as a raised step or threshold. Trim any bushes or trees on the path to your home. Use bright outdoor lighting. Clear any walking paths of anything that might make someone trip, such as rocks or tools. Regularly check to see if handrails are loose or broken. Make sure that both sides of any steps have handrails. Any raised decks and porches should have guardrails on the edges. Have any leaves, snow, or ice cleared regularly. Use sand or salt on walking paths during winter. Clean up any spills in your garage right away. This includes oil or grease spills. What can I do in the bathroom? Use night lights. Install grab bars by the toilet and in the tub and shower. Do not use towel bars as grab bars. Use non-skid mats or decals in the tub or shower. If you need to sit down in the shower, use a plastic, non-slip stool. Keep the floor dry. Clean up any water that spills on the floor as soon as it happens. Remove soap buildup  in the tub or shower regularly. Attach bath mats securely with double-sided non-slip rug tape. Do not have throw rugs and other things on the floor that can make you trip. What can I do in the bedroom? Use night lights. Make sure that you have a light by your bed that is easy to reach. Do not use any sheets or blankets that are too big for your bed. They should not hang down onto the floor. Have a firm chair that has side arms. You can use this for support while you get dressed. Do not have throw rugs and other things on the floor that can make you trip. What can I do in the kitchen? Clean up any spills right away. Avoid walking on wet floors. Keep items that you use a lot in easy-to-reach places. If you need to reach something above you, use a strong step stool that has a grab bar. Keep electrical cords out of the way. Do not use floor polish or wax that makes floors slippery. If you must use wax, use non-skid floor wax. Do not have throw rugs and other things on the floor that  can make you trip. What can I do with my stairs? Do not leave any items on the stairs. Make sure that there are handrails on both sides of the stairs and use them. Fix handrails that are broken or loose. Make sure that handrails are as long as the stairways. Check any carpeting to make sure that it is firmly attached to the stairs. Fix any carpet that is loose or worn. Avoid having throw rugs at the top or bottom of the stairs. If you do have throw rugs, attach them to the floor with carpet tape. Make sure that you have a light switch at the top of the stairs and the bottom of the stairs. If you do not have them, ask someone to add them for you. What else can I do to help prevent falls? Wear shoes that: Do not have high heels. Have rubber bottoms. Are comfortable and fit you well. Are closed at the toe. Do not wear sandals. If you use a stepladder: Make sure that it is fully opened. Do not climb a closed  stepladder. Make sure that both sides of the stepladder are locked into place. Ask someone to hold it for you, if possible. Clearly mark and make sure that you can see: Any grab bars or handrails. First and last steps. Where the edge of each step is. Use tools that help you move around (mobility aids) if they are needed. These include: Canes. Walkers. Scooters. Crutches. Turn on the lights when you go into a dark area. Replace any light bulbs as soon as they burn out. Set up your furniture so you have a clear path. Avoid moving your furniture around. If any of your floors are uneven, fix them. If there are any pets around you, be aware of where they are. Review your medicines with your doctor. Some medicines can make you feel dizzy. This can increase your chance of falling. Ask your doctor what other things that you can do to help prevent falls. This information is not intended to replace advice given to you by your health care provider. Make sure you discuss any questions you have with your health care provider. Document Released: 08/17/2009 Document Revised: 03/28/2016 Document Reviewed: 11/25/2014 Elsevier Interactive Patient Education  2017 [employer].

## 2022-11-22 NOTE — Progress Notes (Signed)
Subjective:  Pt completed SDOH, ADLs, and fall screening during echeck-in on 11/21/22.  Answers verified with pt.    Robin Maldonado is a 72 y.o. female who presents for Medicare Annual (Subsequent) preventive examination.  I connected with  Robin Maldonado on 11/22/22 by a audio enabled telemedicine application and verified that I am speaking with the correct person using two identifiers.  Patient Location: Home  Provider Location: Office/Clinic  I discussed the limitations of evaluation and management by telemedicine. The patient expressed understanding and agreed to proceed.   Review of Systems    Defer to PCP Cardiac Risk Factors include: advanced age (>103mn, >>13women)     Objective:    There were no vitals filed for this visit. There is no height or weight on file to calculate BMI.     11/22/2022    2:08 PM 11/16/2021    3:09 PM 08/17/2019    2:52 PM 08/08/2017    6:22 PM  Advanced Directives  Does Patient Have a Medical Advance Directive? Yes Yes Yes No  Type of AParamedicof ADeer TrailLiving will [place]Living will HH[place]Living will   Does patient want to make changes to medical advance directive? No - Patient declined  No - Patient declined   Copy of HFairviewin Chart? No - copy requested No - copy requested No - copy requested     Current Medications (verified) Outpatient Encounter Medications as of 11/22/2022  Medication Sig   alendronate (FOSAMAX) 70 MG tablet Take 1 tablet (70 mg total) by mouth every 7 (seven) days. Take with a full glass of water on an empty stomach.   [DISCONTINUED] albuterol (VENTOLIN HFA) 108 (90 Base) MCG/ACT inhaler SMARTSIG:By Mouth (Patient not taking: Reported on 06/13/2022)   [DISCONTINUED] fluticasone (FLOVENT HFA) 110 MCG/ACT inhaler Inhale 2 puffs by mouth into the lungs in the morning and at bedtime. (Patient not taking: Reported on  06/13/2022)   No facility-administered encounter medications on file as of 11/22/2022.    Allergies (verified) Hydrocodone-acetaminophen and Tessalon [benzonatate]   History: Past Medical History:  Diagnosis Date   No known health problems    Osteoporosis    Past Surgical History:  Procedure Laterality Date   CRYOTHERAPY     Family History  Problem Relation Age of Onset   Cancer Mother 837      colon   Heart disease Father    Stroke Brother    Diabetes Neg Hx    Hypertension Neg Hx    Social History   Socioeconomic History   Marital status: Widowed    Spouse name: Not on file   Number of children: 1   Years of education: Not on file   Highest education level: Some college, no degree  Occupational History    Comment: retired PPhysicist, medical Tobacco Use   Smoking status: Never    Passive exposure: Never   Smokeless tobacco: Never  Vaping Use   Vaping Use: Never used  Substance and Sexual Activity   Alcohol use: No    Alcohol/week: 0.0 standard drinks of alcohol   Drug use: No   Sexual activity: Never  Other Topics Concern   Not on file  Social History Narrative   Lives alone   Caffeine- 16 oz tea, 8 oz soda but not daily   Social Determinants of Health   Financial Resource Strain: High Risk (11/21/2022)   Overall Financial  Resource Strain (CARDIA)    Difficulty of Paying Living Expenses: Very hard  Food Insecurity: Food Insecurity Present (11/21/2022)   Hunger Vital Sign    Worried About Running Out of Food in the Last Year: Sometimes true    Ran Out of Food in the Last Year: Never true  Transportation Needs: No Transportation Needs (11/21/2022)   PRAPARE - Hydrologist (Medical): No    Lack of Transportation (Non-Medical): No  Physical Activity: Sufficiently Active (11/21/2022)   Exercise Vital Sign    Days of Exercise per Week: 5 days    Minutes of Exercise per Session: 30 min  Stress: No Stress Concern Present  (11/21/2022)   [place]    Feeling of Stress : Only a little  Social Connections: Unknown (11/21/2022)   Social Connection and Isolation Panel [NHANES]    Frequency of Communication with Friends and Family: Once a week    Frequency of Social Gatherings with Friends and Family: Once a week    Attends Religious Services: Patient refused    Active Member of Clubs or Organizations: Yes    Attends Archivist Meetings: 1 to 4 times per year    Marital Status: Widowed  Recent Concern: Social Connections - Moderately Isolated (11/21/2022)   Social Connection and Isolation Panel [NHANES]    Frequency of Communication with Friends and Family: Once a week    Frequency of Social Gatherings with Friends and Family: Once a week    Attends Religious Services: 1 to 4 times per year    Active Member of [employer] or Organizations: Yes    Attends Archivist Meetings: 1 to 4 times per year    Marital Status: Widowed    Tobacco Counseling Counseling given: Not Answered   Clinical Intake:  Pre-visit preparation completed: Yes  Pain : No/denies pain  Diabetes: No  How often do you need to have someone help you when you read instructions, pamphlets, or other written materials from your doctor or pharmacy?: 1 - Never  Activities of Daily Living    11/21/2022    3:21 PM  In your present state of health, do you have any difficulty performing the following activities:  Hearing? 0  Vision? 1  Comment has cataract  Difficulty concentrating or making decisions? 0  Walking or climbing stairs? 0  Dressing or bathing? 0  Doing errands, shopping? 0  Preparing Food and eating ? N  Using the Toilet? N  In the past six months, have you accidently leaked urine? N  Do you have problems with loss of bowel control? N  Managing your Medications? N  Managing your Finances? N  Housekeeping or managing your Housekeeping? N     Patient Care Team: [name], DO as PCP - General (Family Medicine)  Indicate any recent Medical Services you may have received from other than [facility] providers in the past year (date may be approximate).     Assessment:   This is a routine wellness examination for Robin Maldonado.  Hearing/Vision screen No results found.  Dietary issues and exercise activities discussed: Current Exercise Habits: Home exercise routine, Type of exercise: Other - see comments;walking (stationary bike), Time (Minutes): 30, Frequency (Times/Week): 5, Weekly Exercise (Minutes/Week): 150, Intensity: Mild, Exercise limited by: None identified   Goals Addressed   None    Depression Screen    11/22/2022    2:24 PM 11/16/2021    3:11  PM 06/05/2021    1:36 PM 11/24/2020   11:18 AM 08/17/2019    3:03 PM  PHQ 2/9 Scores  PHQ - 2 Score 2 0 0 0 0    Fall Risk    11/21/2022    3:21 PM 11/16/2021    3:10 PM 06/05/2021    1:36 PM 11/24/2020   11:18 AM 08/17/2019    3:03 PM  [place] in the past year? 0 0 0 0 0  Number falls in past yr: 0 0 0 0 0  Injury with Fall? 0 0 0 0 0  Risk for fall due to : No Fall Risks  No Fall Risks    Follow up Falls evaluation completed Falls prevention discussed Falls evaluation completed      FALL RISK PREVENTION PERTAINING TO THE HOME:  Any stairs in or around the home? Yes  If so, are there any without handrails? No  Home free of loose throw rugs in walkways, pet beds, electrical cords, etc? Yes  Adequate lighting in your home to reduce risk of falls? Yes   ASSISTIVE DEVICES UTILIZED TO PREVENT FALLS:  Life alert? No  Use of a cane, walker or w/c? No  Grab bars in the bathroom? No  Shower chair or bench in shower? No  Elevated toilet seat or a handicapped toilet? No   TIMED UP AND GO:  Was the test performed?  No, audio visit .    Cognitive Function:        11/22/2022    2:28 PM  6CIT Screen  What Year? 0 points  What month? 0 points   What time? 0 points  Count back from 20 0 points  Months in reverse 0 points  Repeat phrase 0 points  Total Score 0 points    Immunizations Immunization History  Administered Date(s) Administered   Tdap 07/03/2021    TDAP status: Up to date  Flu Vaccine status: Declined, Education has been provided regarding the importance of this vaccine but patient still declined. Advised may receive this vaccine at local pharmacy or Health Dept. Aware to provide a copy of the vaccination record if obtained from local pharmacy or Health Dept. Verbalized acceptance and understanding.  Pneumococcal vaccine status: Declined,  Education has been provided regarding the importance of this vaccine but patient still declined. Advised may receive this vaccine at local pharmacy or Health Dept. Aware to provide a copy of the vaccination record if obtained from local pharmacy or Health Dept. Verbalized acceptance and understanding.   Covid-19 vaccine status: Declined, Education has been provided regarding the importance of this vaccine but patient still declined. Advised may receive this vaccine at local pharmacy or Health Dept.or vaccine clinic. Aware to provide a copy of the vaccination record if obtained from local pharmacy or Health Dept. Verbalized acceptance and understanding.  Qualifies for Shingles Vaccine? Yes   Zostavax completed No   Shingrix Completed?: No.    Education has been provided regarding the importance of this vaccine. Patient has been advised to call insurance company to determine out of pocket expense if they have not yet received this vaccine. Advised may also receive vaccine at local pharmacy or Health Dept. Verbalized acceptance and understanding.  Screening Tests Health Maintenance  Topic Date Due   Medicare Annual Wellness (AWV)  11/16/2022   INFLUENZA VACCINE  02/02/2023 (Originally 06/04/2022)   Pneumonia Vaccine 30+ Years old (1 - PCV) 11/23/2023 (Originally 01/19/2017)   COVID-19  Vaccine (1) 11/22/2025 (  Originally 01/19/1956)   MAMMOGRAM  09/23/2024   COLONOSCOPY (Pts 45-26yr Insurance coverage will need to be confirmed)  05/04/2028   DTaP/Tdap/Td (2 - Td or Tdap) 07/04/2031   DEXA SCAN  Completed   Hepatitis C Screening  Completed   HPV VACCINES  Aged Out   Zoster Vaccines- Shingrix  Discontinued    Health Maintenance  Health Maintenance Due  Topic Date Due   Medicare Annual Wellness (AWV)  11/16/2022    Colorectal cancer screening: Type of screening: Colonoscopy. Completed 05/04/18. Repeat every 10 years  Mammogram status: Completed 09/23/22. Repeat every year  Bone Density status: Completed 08/02/21. Results reflect: Bone density results: OSTEOPOROSIS. Repeat every 2 years.  Lung Cancer Screening: (Low Dose CT Chest recommended if Age 71-80years, 30 pack-year currently smoking OR have quit w/in 15years.) does not qualify.   Additional Screening:  Hepatitis C Screening: does qualify; Completed 02/04/18  Vision Screening: Recommended annual ophthalmology exams for early detection of glaucoma and other disorders of the eye. Is the patient up to date with their annual eye exam?  Yes  Who is the provider or what is the name of the office in which the patient attends annual eye exams[place] [facility] [place] pt is not established with a provider, would they like to be referred to a provider to establish care? No .   Dental Screening: Recommended annual dental exams for proper oral hygiene  Community Resource Referral / Chronic Care Management: CRR required this visit?  No   CCM required this visit?  No      Plan:     I have personally reviewed and noted the following in the patient's chart:   Medical and social history Use of alcohol, tobacco or illicit drugs  Current medications and supplements including opioid prescriptions. Patient is not currently taking opioid prescriptions. Functional ability and status Nutritional status Physical  activity Advanced directives List of other physicians Hospitalizations, surgeries, and ER visits in previous 12 months Vitals Screenings to include cognitive, depression, and falls Referrals and appointments  In addition, I have reviewed and discussed with patient certain preventive protocols, quality metrics, and best practice recommendations. A written personalized care plan for preventive services as well as general preventive health recommendations were provided to patient.   Due to this being a telephonic visit, the after visit summary with patients personalized plan was offered to patient via mail or my-chart. Patient would like to access on my-chart.  BBeatris Ship [place]  11/22/2022   Nurse Notes: None

## 2022-11-24 NOTE — Progress Notes (Signed)
I have reviewed and agree with [facility] documentation.  Kathlene November, MD

## 2022-12-03 DIAGNOSIS — H2513 Age-related nuclear cataract, bilateral: Secondary | ICD-10-CM | POA: Diagnosis not present

## 2022-12-03 DIAGNOSIS — H18413 Arcus senilis, bilateral: Secondary | ICD-10-CM | POA: Diagnosis not present

## 2022-12-03 DIAGNOSIS — H25013 Cortical age-related cataract, bilateral: Secondary | ICD-10-CM | POA: Diagnosis not present

## 2022-12-03 DIAGNOSIS — H2511 Age-related nuclear cataract, right eye: Secondary | ICD-10-CM | POA: Diagnosis not present

## 2022-12-03 DIAGNOSIS — H25043 Posterior subcapsular polar age-related cataract, bilateral: Secondary | ICD-10-CM | POA: Diagnosis not present

## 2023-02-07 DIAGNOSIS — H2511 Age-related nuclear cataract, right eye: Secondary | ICD-10-CM | POA: Diagnosis not present

## 2023-02-07 DIAGNOSIS — H2512 Age-related nuclear cataract, left eye: Secondary | ICD-10-CM | POA: Diagnosis not present

## 2023-02-07 DIAGNOSIS — H25091 Other age-related incipient cataract, right eye: Secondary | ICD-10-CM | POA: Diagnosis not present

## 2023-02-21 DIAGNOSIS — H2512 Age-related nuclear cataract, left eye: Secondary | ICD-10-CM | POA: Diagnosis not present

## 2023-02-21 DIAGNOSIS — H25092 Other age-related incipient cataract, left eye: Secondary | ICD-10-CM | POA: Diagnosis not present

## 2023-07-05 ENCOUNTER — Other Ambulatory Visit: Payer: Self-pay | Admitting: Family Medicine

## 2023-07-08 NOTE — Telephone Encounter (Signed)
Called left detailed that she needs to schedule OV with PCP to continue getting refills

## 2023-07-08 NOTE — Telephone Encounter (Signed)
Patient scheduled appointment for 07/09/2023.

## 2023-07-08 NOTE — Telephone Encounter (Signed)
Last OV--01/30/22 Will call to schedule appt

## 2023-07-09 ENCOUNTER — Encounter: Payer: Self-pay | Admitting: Family Medicine

## 2023-07-09 ENCOUNTER — Ambulatory Visit (INDEPENDENT_AMBULATORY_CARE_PROVIDER_SITE_OTHER): Payer: PPO | Admitting: Family Medicine

## 2023-07-09 VITALS — BP 108/62 | HR 68 | Temp 98.0°F | Ht 64.0 in | Wt 136.2 lb

## 2023-07-09 DIAGNOSIS — M810 Age-related osteoporosis without current pathological fracture: Secondary | ICD-10-CM

## 2023-07-09 LAB — COMPREHENSIVE METABOLIC PANEL
ALT: 10 U/L (ref 0–35)
AST: 17 U/L (ref 0–37)
Albumin: 4.2 g/dL (ref 3.5–5.2)
Alkaline Phosphatase: 65 U/L (ref 39–117)
BUN: 12 mg/dL (ref 6–23)
CO2: 27 meq/L (ref 19–32)
Calcium: 9.2 mg/dL (ref 8.4–10.5)
Chloride: 103 meq/L (ref 96–112)
Creatinine, Ser: 0.87 mg/dL (ref 0.40–1.20)
GFR: 67.01 mL/min (ref >60.00)
Glucose, Bld: 89 mg/dL (ref 70–99)
Potassium: 4.1 meq/L (ref 3.5–5.1)
Sodium: 140 meq/L (ref 135–145)
Total Bilirubin: 0.4 mg/dL (ref 0.2–1.2)
Total Protein: 6.8 g/dL (ref 6.0–8.3)

## 2023-07-09 LAB — VITAMIN D 25 HYDROXY (VIT D DEFICIENCY, FRACTURES): VITD: 26.73 ng/mL — ABNORMAL LOW (ref 30.00–100.00)

## 2023-07-09 NOTE — Patient Instructions (Addendum)
Keep the diet clean and stay active.  Take at least 1000 units of vitamin D3 daily.   Heat (pad or rice pillow in microwave) over affected area, 10-15 minutes twice daily.   Ice/cold pack over area for 10-15 min twice daily.  OK to take Tylenol 1000 mg (2 extra strength tabs) or 975 mg (3 regular strength tabs) every 6 hours as needed.  Let us know if you need anything.  Healthy Eating Plan Many factors influence your heart health, including eating and exercise habits. Heart (coronary) risk increases with abnormal blood fat (lipid) levels. Heart-healthy meal planning includes limiting unhealthy fats, increasing healthy fats, and making other small dietary changes. This includes maintaining a healthy body weight to help keep lipid levels within a normal range.  WHAT IS MY PLAN?  Your health care provider recommends that you: Drink a glass of water before meals to help with satiety. Eat slowly. An alternative to the water is to add Metamucil. This will help with satiety as well. It does contain calories, unlike water.  WHAT TYPES OF FAT SHOULD I CHOOSE? Choose healthy fats more often. Choose monounsaturated and polyunsaturated fats, such as olive oil and canola oil, flaxseeds, walnuts, almonds, and seeds. Eat more omega-3 fats. Good choices include salmon, mackerel, sardines, tuna, flaxseed oil, and ground flaxseeds. Aim to eat fish at least two times each week. Avoid foods with partially hydrogenated oils in them. These contain trans fats. Examples of foods that contain trans fats are stick margarine, some tub margarines, cookies, crackers, and other baked goods. If you are going to avoid a fat, this is the one to avoid!  WHAT GENERAL GUIDELINES DO I NEED TO FOLLOW? Check food labels carefully to identify foods with trans fats. Avoid these types of options when possible. Fill one half of your plate with vegetables and green salads. Eat 4-5 servings of vegetables per day. A serving of  vegetables equals 1 cup of raw leafy vegetables,  cup of raw or cooked cut-up vegetables, or  cup of vegetable juice. Fill one fourth of your plate with whole grains. Look for the word "whole" as the first word in the ingredient list. Fill one fourth of your plate with lean protein foods. Eat 4-5 servings of fruit per day. A serving of fruit equals one medium whole fruit,  cup of dried fruit,  cup of fresh, frozen, or canned fruit. Try to avoid fruits in cups/syrups as the sugar content can be high. Eat more foods that contain soluble fiber. Examples of foods that contain this type of fiber are apples, broccoli, carrots, beans, peas, and barley. Aim to get 20-30 g of fiber per day. Eat more home-cooked food and less restaurant, buffet, and fast food. Limit or avoid alcohol. Limit foods that are high in starch and sugar. Avoid fried foods when able. Cook foods by using methods other than frying. Baking, boiling, grilling, and broiling are all great options. Other fat-reducing suggestions include: Removing the skin from poultry. Removing all visible fats from meats. Skimming the fat off of stews, soups, and gravies before serving them. Steaming vegetables in water or broth. Lose weight if you are overweight. Losing just 5-10% of your initial body weight can help your overall health and prevent diseases such as diabetes and heart disease. Increase your consumption of nuts, legumes, and seeds to 4-5 servings per week. One serving of dried beans or legumes equals  cup after being cooked, one serving of nuts equals 1 ounces, and one  serving of seeds equals  ounce or 1 tablespoon.  WHAT ARE GOOD FOODS CAN I EAT? Grains Grainy breads (try to find bread that is 3 g of fiber per slice or greater), oatmeal, light popcorn. Whole-grain cereals. Rice and pasta, including brown rice and those that are made with whole wheat. Edamame pasta is a great alternative to grain pasta. It has a higher protein  content. Try to avoid significant consumption of white bread, sugary cereals, or pastries/baked goods.  Vegetables All vegetables. Cooked white potatoes do not count as vegetables.  Fruits All fruits, but limit pineapple and bananas as these fruits have a higher sugar content.  Meats and Other Protein Sources Lean, well-trimmed beef, veal, pork, and lamb. Chicken and [place] without skin. All fish and shellfish. Wild duck, rabbit, pheasant, and venison. Egg whites or low-cholesterol egg substitutes. Dried beans, peas, lentils, and tofu. Seeds and most nuts.  Dairy Low-fat or nonfat cheeses, including ricotta, string, and mozzarella. Skim or 1% milk that is liquid, powdered, or evaporated. Buttermilk that is made with low-fat milk. Nonfat or low-fat yogurt. Soy/Almond milk are good alternatives if you cannot handle dairy.  Beverages Water is the best for you. Sports drinks with less sugar are more desirable unless you are a highly active athlete.  Sweets and Desserts Sherbets and fruit ices. Honey, jam, marmalade, jelly, and syrups. Dark chocolate.  Eat all sweets and desserts in moderation.  Fats and Oils Nonhydrogenated (trans-free) margarines. Vegetable oils, including soybean, sesame, sunflower, olive, peanut, safflower, corn, canola, and cottonseed. Salad dressings or mayonnaise that are made with a vegetable oil. Limit added fats and oils that you use for cooking, baking, salads, and as spreads.  Other Cocoa powder. Coffee and tea. Most condiments.  The items listed above may not be a complete list of recommended foods or beverages. Contact your dietitian for more options.  Mid-Back Strain Rehab It is normal to feel mild stretching, pulling, tightness, or discomfort as you do these exercises, but you should stop right away if you feel sudden pain or your pain gets worse.  Stretching and range of motion exercises This exercise warms up your muscles and joints and improves the  movement and flexibility of your back and shoulders. This exercise also help to relieve pain. Exercise A: Chest and spine stretch  Lie down on your back on a firm surface. Roll a towel or a small blanket so it is about 4 inches (10 cm) in diameter. Put the towel lengthwise under the middle of your back so it is under your spine, but not under your shoulder blades. To increase the stretch, you may put your hands behind your head and let your elbows fall to your sides. Hold for 30 seconds. Repeat exercise 2 times. Complete this exercise 3 times per week. Strengthening exercises These exercises build strength and endurance in your back and your shoulder blade muscles. Endurance is the ability to use your muscles for a long time, even after they get tired. Exercise C: Straight arm rows (shoulder extension) Stand with your feet shoulder width apart. Secure an exercise band to a stable object in front of you so the band is at or above shoulder height. Hold one end of the exercise band in each hand. Straighten your elbows and lift your hands up to shoulder height. Step back, away from the secured end of the exercise band, until the band stretches. Squeeze your shoulder blades together and pull your hands down to the sides of your  thighs. Stop when your hands are straight down by your sides. Do not let your hands go behind your body. Hold for 2 seconds. Slowly return to the starting position. Repeat 2 times. Complete this exercise 3 times per week. Exercise D: Shoulder external rotation, prone Lie on your abdomen on a firm bed so your left / right forearm hangs over the edge of the bed and your upper arm is on the bed, straight out from your body. Your elbow should be bent. Your palm should be facing your feet. If instructed, hold a 2-5 lb weight in your hand. Squeeze your shoulder blade toward the middle of your back. Do not let your shoulder lift toward your ear. Keep your elbow bent in an "L"  shape (90 degrees) while you slowly move your forearm up toward the ceiling. Move your forearm up to the height of the bed, toward your head. Your upper arm should not move. At the top of the movement, your palm should face the floor. Hold for 1 second. Slowly return to the starting position and relax your muscles. Repeat 2 times. Complete this exercise 3 times per week. Exercise E: Scapular retraction and external rotation, rowing  Sit in a stable chair without armrests, or stand. Secure an exercise band to a stable object in front of you so it is at shoulder height. Hold one end of the exercise band in each hand. Bring your arms out straight in front of you. Step back, away from the secured end of the exercise band, until the band stretches. Pull the band backward. As you do this, bend your elbows and squeeze your shoulder blades together, but avoid letting the rest of your body move. Do not let your shoulders lift up toward your ears. Stop when your elbows are at your sides or slightly behind your body. Hold for 1 second1. Slowly straighten your arms to return to the starting position. Repeat 2 times. Complete this exercise 3 times per week. Posture and body mechanics  Body mechanics refers to the movements and positions of your body while you do your daily activities. Posture is part of body mechanics. Good posture and healthy body mechanics can help to relieve stress in your body's tissues and joints. Good posture means that your spine is in its natural S-curve position (your spine is neutral), your shoulders are pulled back slightly, and your head is not tipped forward. The following are general guidelines for applying improved posture and body mechanics to your everyday activities. Standing  When standing, keep your spine neutral and your feet about hip-width apart. Keep a slight bend in your knees. Your ears, shoulders, and hips should line up. When you do a task in which you lean  forward while standing in one place for a long time, place one foot up on a stable object that is 2-4 inches (5-10 cm) high, such as a footstool. This helps keep your spine neutral. Sitting  When sitting, keep your spine neutral and keep your feet flat on the floor. Use a footrest, if necessary, and keep your thighs parallel to the floor. Avoid rounding your shoulders, and avoid tilting your head forward. When working at a desk or a computer, keep your desk at a height where your hands are slightly lower than your elbows. Slide your chair under your desk so you are close enough to maintain good posture. When working at a computer, place your monitor at a height where you are looking straight ahead and you  do not have to tilt your head forward or downward to look at the screen. Resting  When lying down and resting, avoid positions that are most painful for you. If you have pain with activities such as sitting, bending, stooping, or squatting (flexion-based activities), lie in a position in which your body does not bend very much. For example, avoid curling up on your side with your arms and knees near your chest (fetal position). If you have pain with activities such as standing for a long time or reaching with your arms (extension-based activities), lie with your spine in a neutral position and bend your knees slightly. Try the following positions: Lying on your side with a pillow between your knees. Lying on your back with a pillow under your knees.  Lifting  When lifting objects, keep your feet at least shoulder-width apart and tighten your abdominal muscles. Bend your knees and hips and keep your spine neutral. It is important to lift using the strength of your legs, not your back. Do not lock your knees straight out. Always ask for help to lift heavy or awkward objects. Make sure you discuss any questions you have with your health care provider. Document Released: 10/21/2005 Document Revised:  06/27/2016 Document Reviewed: 08/02/2015 Elsevier Interactive Patient Education  [employer].

## 2023-07-09 NOTE — Progress Notes (Signed)
Chief Complaint  Patient presents with   Follow-up    Subjective: Patient is a 72 y.o. female here for f/u.  Taking Fosamax 70 mg/week. Compliant, no AE's. Line dancing, cycling, walking. Does not take Ca/Vit D. Ca constipates her. Diet could be better.   Past Medical History:  Diagnosis Date   No known health problems    Osteoporosis     Objective: BP 108/62 (BP Location: Left Arm, Patient Position: Sitting, Cuff Size: Normal)   Pulse 68   Temp 98 F (36.7 C) (Oral)   Ht 5\' 4"  (1.626 m)   Wt 136 lb 4 oz (61.8 kg)   SpO2 97%   BMI 23.39 kg/m  General: Awake, appears stated age Heart: RRR, no LE edema Lungs: CTAB, no rales, wheezes or rhonchi. No accessory muscle use Psych: Age appropriate judgment and insight, normal affect and mood  Assessment and Plan: Age-related osteoporosis without current pathological fracture - Plan: Comprehensive metabolic panel, VITAMIN D 25 Hydroxy (Vit-D Deficiency, Fractures)  Chronic, stable. Cont Fosamax 70 mg/week. Rec'd taking Vit D as Ca constipates her. Add upper body wt resistance exercises.  F/u in 6 mo for CPE.  The patient voiced understanding and agreement to the plan.  Jilda Roche [employer], DO 07/09/23  1:10 PM

## 2023-07-11 ENCOUNTER — Encounter: Payer: Self-pay | Admitting: Family Medicine

## 2023-07-11 ENCOUNTER — Other Ambulatory Visit: Payer: Self-pay | Admitting: Family Medicine

## 2023-07-11 DIAGNOSIS — E559 Vitamin D deficiency, unspecified: Secondary | ICD-10-CM

## 2023-11-20 ENCOUNTER — Other Ambulatory Visit (HOSPITAL_BASED_OUTPATIENT_CLINIC_OR_DEPARTMENT_OTHER): Payer: Self-pay | Admitting: Family Medicine

## 2023-11-20 DIAGNOSIS — Z1231 Encounter for screening mammogram for malignant neoplasm of breast: Secondary | ICD-10-CM

## 2023-11-25 ENCOUNTER — Ambulatory Visit (INDEPENDENT_AMBULATORY_CARE_PROVIDER_SITE_OTHER): Payer: PPO

## 2023-11-25 VITALS — Ht 64.0 in | Wt 136.0 lb

## 2023-11-25 DIAGNOSIS — Z0000 Encounter for general adult medical examination without abnormal findings: Secondary | ICD-10-CM | POA: Diagnosis not present

## 2023-11-25 NOTE — Progress Notes (Signed)
Subjective:   Robin Maldonado is a 73 y.o. female who presents for Medicare Annual (Subsequent) preventive examination.  Visit Complete: Virtual I connected with  Robin Maldonado on 11/25/23 by a audio enabled telemedicine application and verified that I am speaking with the correct person using two identifiers.  Patient Location: Home  Provider Location: Home Office  I discussed the limitations of evaluation and management by telemedicine. The patient expressed understanding and agreed to proceed.  Vital Signs: Because this visit was a virtual/telehealth visit, some criteria may be missing or patient reported. Any vitals not documented were not able to be obtained and vitals that have been documented are patient reported.    Cardiac Risk Factors include: advanced age (>83men, >84 women)     Objective:    Today's Vitals   11/25/23 1404  Weight: 136 lb (61.7 kg)  Height: 5\' 4"  (1.626 m)   Body mass index is 23.34 kg/m.     11/25/2023    2:11 PM 11/22/2022    2:08 PM 11/16/2021    3:09 PM 08/17/2019    2:52 PM 08/08/2017    6:22 PM  Advanced Directives  Does Patient Have a Medical Advance Directive? Yes Yes Yes Yes No  Type of Estate agent of [place];Living will Healthcare Power of [place];Living will Healthcare Power of [place];Living will Healthcare Power of [place];Living will   Does patient want to make changes to medical advance directive?  No - Patient declined  No - Patient declined   Copy of Healthcare Power of Attorney in Chart? No - copy requested No - copy requested No - copy requested No - copy requested     Current Medications (verified) Outpatient Encounter Medications as of 11/25/2023  Medication Sig   alendronate (FOSAMAX) 70 MG tablet TAKE 1 TABLET(70 MG) BY MOUTH EVERY 7 DAYS WITH A FULL GLASS OF WATER AND ON AN EMPTY STOMACH   No facility-administered encounter medications on file as of 11/25/2023.    Allergies  (verified) Hydrocodone-acetaminophen and Tessalon [benzonatate]   History: Past Medical History:  Diagnosis Date   No known health problems    Osteoporosis    Past Surgical History:  Procedure Laterality Date   CRYOTHERAPY     Family History  Problem Relation Age of Onset   Cancer Mother 16       colon   Heart disease Father    Stroke Brother    Diabetes Neg Hx    Hypertension Neg Hx    Social History   Socioeconomic History   Marital status: Widowed    Spouse name: Not on file   Number of children: 1   Years of education: Not on file   Highest education level: Some college, no degree  Occupational History    Comment: retired Tour manager  Tobacco Use   Smoking status: Never    Passive exposure: Never   Smokeless tobacco: Never  Vaping Use   Vaping status: Never Used  Substance and Sexual Activity   Alcohol use: No    Alcohol/week: 0.0 standard drinks of alcohol   Drug use: No   Sexual activity: Never  Other Topics Concern   Not on file  Social History Narrative   Lives alone   Caffeine- 16 oz tea, 8 oz soda but not daily   Social Drivers of Health   Financial Resource Strain: Low Risk  (11/25/2023)   Overall Financial Resource Strain (CARDIA)    Difficulty of Paying Living Expenses:  Not hard at all  Food Insecurity: No Food Insecurity (11/25/2023)   Hunger Vital Sign    Worried About Running Out of Food in the Last Year: Never true    Ran Out of Food in the Last Year: Never true  Transportation Needs: No Transportation Needs (11/25/2023)   PRAPARE - Administrator, Civil Service (Medical): No    Lack of Transportation (Non-Medical): No  Physical Activity: Sufficiently Active (11/25/2023)   Exercise Vital Sign    Days of Exercise per Week: 7 days    Minutes of Exercise per Session: 30 min  Stress: No Stress Concern Present (11/25/2023)   [employer] of Occupational Health - Occupational Stress Questionnaire    Feeling of Stress :  Not at all  Social Connections: Moderately Integrated (11/25/2023)   Social Connection and Isolation Panel [NHANES]    Frequency of Communication with Friends and Family: More than three times a week    Frequency of Social Gatherings with Friends and Family: More than three times a week    Attends Religious Services: More than 4 times per year    Active Member of [employer] or Organizations: Yes    Attends Banker Meetings: More than 4 times per year    Marital Status: Widowed    Tobacco Counseling Counseling given: Not Answered   Clinical Intake:  Pre-visit preparation completed: Yes  Pain : No/denies pain     BMI - recorded: 23.34 Nutritional Status: BMI of 19-24  Normal Nutritional Risks: None Diabetes: No  How often do you need to have someone help you when you read instructions, pamphlets, or other written materials from your doctor or pharmacy?: 1 - Never  Interpreter Needed?: No  Information entered by :: Lucindia Lemley LPN   Activities of Daily Living    11/25/2023    2:10 PM  In your present state of health, do you have any difficulty performing the following activities:  Hearing? 0  Vision? 0  Difficulty concentrating or making decisions? 0  Walking or climbing stairs? 0  Dressing or bathing? 0  Doing errands, shopping? 0  Preparing Food and eating ? N  Using the Toilet? N  In the past six months, have you accidently leaked urine? N  Do you have problems with loss of bowel control? N  Managing your Medications? N  Managing your Finances? N  Housekeeping or managing your Housekeeping? N    Patient Care Team: Sharlene Dory, DO as PCP - General (Family Medicine)  Indicate any recent Medical Services you may have received from other than [facility] providers in the past year (date may be approximate).     Assessment:   This is a routine wellness examination for Robin Maldonado.  Hearing/Vision screen Hearing Screening - Comments:: Denies hearing  difficulties   Vision Screening - Comments:: - up to date with routine eye exams with  [facility]   Goals Addressed               This Visit's Progress     Stay Active and healthy (pt-stated)         Depression Screen    11/25/2023    2:09 PM 11/22/2022    2:24 PM 11/16/2021    3:11 PM 06/05/2021    1:36 PM 11/24/2020   11:18 AM 08/17/2019    3:03 PM  PHQ 2/9 Scores  PHQ - 2 Score 0 2 0 0 0 0    Fall Risk  11/25/2023    2:10 PM 11/21/2022    3:21 PM 11/16/2021    3:10 PM 06/05/2021    1:36 PM 11/24/2020   11:18 AM  Fall Risk   Falls in the past year? 0 0 0 0 0  Number falls in past yr: 0 0 0 0 0  Injury with Fall? 0 0 0 0 0  Risk for fall due to : No Fall Risks No Fall Risks  No Fall Risks   Follow up Falls prevention discussed Falls evaluation completed Falls prevention discussed Falls evaluation completed     MEDICARE RISK AT HOME: Medicare Risk at Home Any stairs in or around the home?: Yes If so, are there any without handrails?: No Home free of loose throw rugs in walkways, pet beds, electrical cords, etc?: Yes Adequate lighting in your home to reduce risk of falls?: Yes Life alert?: No Use of a cane, walker or w/c?: No Grab bars in the bathroom?: No Shower chair or bench in shower?: No Elevated toilet seat or a handicapped toilet?: No  TIMED UP AND GO:  Was the test performed?  No    Cognitive Function:        11/25/2023    2:11 PM 11/22/2022    2:28 PM  6CIT Screen  What Year? 0 points 0 points  What month? 0 points 0 points  What time? 0 points 0 points  Count back from 20 0 points 0 points  Months in reverse 0 points 0 points  Repeat phrase 0 points 0 points  Total Score 0 points 0 points    Immunizations Immunization History  Administered Date(s) Administered   Tdap 07/03/2021    TDAP status: Up to date  Flu Vaccine status: Declined, Education has been provided regarding the importance of this vaccine but patient still declined.  Advised may receive this vaccine at local pharmacy or Health Dept. Aware to provide a copy of the vaccination record if obtained from local pharmacy or Health Dept. Verbalized acceptance and understanding.  Pneumococcal vaccine status: Declined,  Education has been provided regarding the importance of this vaccine but patient still declined. Advised may receive this vaccine at local pharmacy or Health Dept. Aware to provide a copy of the vaccination record if obtained from local pharmacy or Health Dept. Verbalized acceptance and understanding.       Screening Tests Health Maintenance  Topic Date Due   Pneumonia Vaccine 25+ Years old (1 of 1 - PCV) Never done   INFLUENZA VACCINE  Never done   COVID-19 Vaccine (1) 11/22/2025 (Originally 01/19/1956)   MAMMOGRAM  09/23/2024   Medicare Annual Wellness (AWV)  11/24/2024   DTaP/Tdap/Td (2 - Td or Tdap) 07/04/2031   Colonoscopy  09/18/2031   DEXA SCAN  Completed   Hepatitis C Screening  Completed   HPV VACCINES  Aged Out   Zoster Vaccines- Shingrix  Discontinued    Health Maintenance  Health Maintenance Due  Topic Date Due   Pneumonia Vaccine 40+ Years old (1 of 1 - PCV) Never done   INFLUENZA VACCINE  Never done    Colorectal cancer screening: Type of screening: Colonoscopy. Completed 09/17/21. Repeat every 10 years  Mammogram status: Ordered Scheduled for 11/30/23. Pt provided with contact info and advised to call to schedule appt.   Bone Density status: Completed 08/02/21. Results reflect: Bone density results: OSTEOPENIA. Repeat every   years.   Additional Screening:  Hepatitis C Screening: does qualify; Completed 02/04/18  Vision Screening: Recommended annual  ophthalmology exams for early detection of glaucoma and other disorders of the eye. Is the patient up to date with their annual eye exam?  Yes  Who is the provider or what is the name of the office in which the patient attends annual eye exams? [facility] If pt is not  established with a provider, would they like to be referred to a provider to establish care? No .   Dental Screening: Recommended annual dental exams for proper oral hygiene    Community Resource Referral / Chronic Care Management:  CRR required this visit?  No   CCM required this visit?  No     Plan:     I have personally reviewed and noted the following in the patient's chart:   Medical and social history Use of alcohol, tobacco or illicit drugs  Current medications and supplements including opioid prescriptions. Patient is not currently taking opioid prescriptions. Functional ability and status Nutritional status Physical activity Advanced directives List of other physicians Hospitalizations, surgeries, and ER visits in previous 12 months Vitals Screenings to include cognitive, depression, and falls Referrals and appointments  In addition, I have reviewed and discussed with patient certain preventive protocols, quality metrics, and best practice recommendations. A written personalized care plan for preventive services as well as general preventive health recommendations were provided to patient.     Tillie Rung, LPN   04/01/4131   After Visit Summary: (MyChart) Due to this being a telephonic visit, the after visit summary with patients personalized plan was offered to patient via MyChart   Nurse Notes: None

## 2023-11-25 NOTE — Patient Instructions (Addendum)
Ms. Volk , Thank you for taking time to come for your Medicare Wellness Visit. I appreciate your ongoing commitment to your health goals. Please review the following plan we discussed and let me know if I can assist you in the future.   Referrals/Orders/Follow-Ups/Clinician Recommendations:   This is a list of the screening recommended for you and due dates:  Health Maintenance  Topic Date Due   Pneumonia Vaccine (1 of 1 - PCV) Never done   Flu Shot  Never done   COVID-19 Vaccine (1) 11/22/2025*   Mammogram  09/23/2024   Medicare Annual Wellness Visit  11/24/2024   DTaP/Tdap/Td vaccine (2 - Td or Tdap) 07/04/2031   Colon Cancer Screening  09/18/2031   DEXA scan (bone density measurement)  Completed   Hepatitis C Screening  Completed   HPV Vaccine  Aged Out   Zoster (Shingles) Vaccine  Discontinued  *Topic was postponed. The date shown is not the original due date.    Advanced directives: (Copy Requested) Please bring a copy of your health care power of attorney and living will to the office to be added to your chart at your convenience.  Next Medicare Annual Wellness Visit scheduled for next year: Yes

## 2023-12-02 ENCOUNTER — Inpatient Hospital Stay (HOSPITAL_BASED_OUTPATIENT_CLINIC_OR_DEPARTMENT_OTHER): Admission: RE | Admit: 2023-12-02 | Payer: PPO | Source: Ambulatory Visit

## 2023-12-11 ENCOUNTER — Ambulatory Visit (HOSPITAL_BASED_OUTPATIENT_CLINIC_OR_DEPARTMENT_OTHER): Payer: PPO

## 2023-12-18 ENCOUNTER — Encounter (HOSPITAL_BASED_OUTPATIENT_CLINIC_OR_DEPARTMENT_OTHER): Payer: Self-pay

## 2023-12-18 ENCOUNTER — Other Ambulatory Visit (INDEPENDENT_AMBULATORY_CARE_PROVIDER_SITE_OTHER): Payer: PPO

## 2023-12-18 ENCOUNTER — Ambulatory Visit (HOSPITAL_BASED_OUTPATIENT_CLINIC_OR_DEPARTMENT_OTHER)
Admission: RE | Admit: 2023-12-18 | Discharge: 2023-12-18 | Disposition: A | Discharge status: Home / Self Care | Payer: PPO | Source: Ambulatory Visit | Attending: Family Medicine | Admitting: Family Medicine

## 2023-12-18 DIAGNOSIS — E559 Vitamin D deficiency, unspecified: Secondary | ICD-10-CM

## 2023-12-18 DIAGNOSIS — Z1231 Encounter for screening mammogram for malignant neoplasm of breast: Secondary | ICD-10-CM | POA: Insufficient documentation

## 2023-12-19 ENCOUNTER — Encounter: Payer: Self-pay | Admitting: Family Medicine

## 2023-12-19 LAB — VITAMIN D 25 HYDROXY (VIT D DEFICIENCY, FRACTURES): VITD: 30.35 ng/mL (ref 30.00–100.00)

## 2024-03-04 DIAGNOSIS — D485 Neoplasm of uncertain behavior of skin: Secondary | ICD-10-CM | POA: Diagnosis not present

## 2024-03-04 DIAGNOSIS — D225 Melanocytic nevi of trunk: Secondary | ICD-10-CM | POA: Diagnosis not present

## 2024-03-04 DIAGNOSIS — L820 Inflamed seborrheic keratosis: Secondary | ICD-10-CM | POA: Diagnosis not present

## 2024-05-04 DIAGNOSIS — D2239 Melanocytic nevi of other parts of face: Secondary | ICD-10-CM | POA: Diagnosis not present

## 2024-05-04 DIAGNOSIS — L570 Actinic keratosis: Secondary | ICD-10-CM | POA: Diagnosis not present

## 2024-05-04 DIAGNOSIS — L578 Other skin changes due to chronic exposure to nonionizing radiation: Secondary | ICD-10-CM | POA: Diagnosis not present

## 2024-05-04 DIAGNOSIS — D225 Melanocytic nevi of trunk: Secondary | ICD-10-CM | POA: Diagnosis not present

## 2024-05-04 DIAGNOSIS — L814 Other melanin hyperpigmentation: Secondary | ICD-10-CM | POA: Diagnosis not present

## 2024-05-04 DIAGNOSIS — L820 Inflamed seborrheic keratosis: Secondary | ICD-10-CM | POA: Diagnosis not present

## 2024-05-04 DIAGNOSIS — D485 Neoplasm of uncertain behavior of skin: Secondary | ICD-10-CM | POA: Diagnosis not present

## 2024-06-07 ENCOUNTER — Other Ambulatory Visit: Payer: Self-pay | Admitting: Family Medicine

## 2024-07-26 ENCOUNTER — Ambulatory Visit: Admitting: Family Medicine

## 2024-11-23 ENCOUNTER — Other Ambulatory Visit (HOSPITAL_BASED_OUTPATIENT_CLINIC_OR_DEPARTMENT_OTHER): Payer: Self-pay | Admitting: Family Medicine

## 2024-11-23 DIAGNOSIS — Z1231 Encounter for screening mammogram for malignant neoplasm of breast: Secondary | ICD-10-CM

## 2024-12-27 ENCOUNTER — Ambulatory Visit (HOSPITAL_BASED_OUTPATIENT_CLINIC_OR_DEPARTMENT_OTHER)
